# Patient Record
Sex: Female | Born: 1990 | Race: Black or African American | Hispanic: No | Marital: Single | Smoking: Never smoker
Health system: Southern US, Community
[De-identification: ages and names within clinical notes are randomized; demographics above are authoritative.]

## PROBLEM LIST (undated history)

## (undated) ENCOUNTER — Emergency Department: Admission: EM | Payer: Medicaid Other | Source: Home / Self Care

## (undated) DIAGNOSIS — F419 Anxiety disorder, unspecified: Secondary | ICD-10-CM

## (undated) DIAGNOSIS — F32A Depression, unspecified: Secondary | ICD-10-CM

## (undated) HISTORY — PX: OTHER SURGICAL HISTORY: SHX169

---
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
## 2013-11-19 ENCOUNTER — Emergency Department: Payer: Self-pay | Admitting: Internal Medicine

## 2014-01-27 ENCOUNTER — Emergency Department: Payer: Self-pay | Admitting: Emergency Medicine

## 2014-01-29 LAB — BASIC METABOLIC PANEL
Anion Gap: 3 — ABNORMAL LOW (ref 7–16)
BUN: 18 mg/dL (ref 7–18)
Calcium, Total: 9.1 mg/dL (ref 8.5–10.1)
Chloride: 107 mmol/L (ref 98–107)
Co2: 29 mmol/L (ref 21–32)
Creatinine: 0.84 mg/dL (ref 0.60–1.30)
EGFR (African American): >60
EGFR (Non-African Amer.): >60
Glucose: 102 mg/dL — ABNORMAL HIGH (ref 65–99)
Osmolality: 280 (ref 275–301)
Potassium: 4.1 mmol/L (ref 3.5–5.1)
Sodium: 139 mmol/L (ref 136–145)

## 2014-01-29 LAB — CBC
HCT: 36.1 % (ref 35.0–47.0)
HGB: 11.9 g/dL — AB (ref 12.0–16.0)
MCH: 27.2 pg (ref 26.0–34.0)
MCHC: 33 g/dL (ref 32.0–36.0)
MCV: 82 fL (ref 80–100)
PLATELETS: 247 10*3/uL (ref 150–440)
RBC: 4.39 10*6/uL (ref 3.80–5.20)
RDW: 14.3 % (ref 11.5–14.5)
WBC: 9.4 10*3/uL (ref 3.6–11.0)

## 2014-01-30 ENCOUNTER — Inpatient Hospital Stay: Payer: Self-pay | Admitting: Internal Medicine

## 2014-01-30 LAB — BETA STREP CULTURE(ARMC)

## 2014-01-31 LAB — CBC WITH DIFFERENTIAL/PLATELET
BASOS ABS: 0 10*3/uL (ref 0.0–0.1)
Basophil %: 0.3 %
EOS ABS: 0.2 10*3/uL (ref 0.0–0.7)
EOS PCT: 1.7 %
HCT: 34.2 % — AB (ref 35.0–47.0)
HGB: 11.1 g/dL — AB (ref 12.0–16.0)
LYMPHS PCT: 24.4 %
Lymphocyte #: 2.2 10*3/uL (ref 1.0–3.6)
MCH: 26.9 pg (ref 26.0–34.0)
MCHC: 32.4 g/dL (ref 32.0–36.0)
MCV: 83 fL (ref 80–100)
MONO ABS: 0.6 x10 3/mm (ref 0.2–0.9)
MONOS PCT: 6.8 %
Neutrophil #: 5.9 10*3/uL (ref 1.4–6.5)
Neutrophil %: 66.8 %
Platelet: 245 10*3/uL (ref 150–440)
RBC: 4.12 10*6/uL (ref 3.80–5.20)
RDW: 14.8 % — AB (ref 11.5–14.5)
WBC: 8.9 10*3/uL (ref 3.6–11.0)

## 2014-01-31 LAB — BASIC METABOLIC PANEL
Anion Gap: 7 (ref 7–16)
BUN: 13 mg/dL (ref 7–18)
CALCIUM: 8.7 mg/dL (ref 8.5–10.1)
CHLORIDE: 105 mmol/L (ref 98–107)
CO2: 25 mmol/L (ref 21–32)
CREATININE: 0.7 mg/dL (ref 0.60–1.30)
Glucose: 100 mg/dL — ABNORMAL HIGH (ref 65–99)
OSMOLALITY: 274 (ref 275–301)
Potassium: 3.7 mmol/L (ref 3.5–5.1)
SODIUM: 137 mmol/L (ref 136–145)

## 2014-02-04 LAB — CULTURE, BLOOD (SINGLE)

## 2014-02-09 ENCOUNTER — Observation Stay: Payer: Self-pay | Admitting: Internal Medicine

## 2014-02-09 LAB — CBC WITH DIFFERENTIAL/PLATELET
BASOS ABS: 0 10*3/uL (ref 0.0–0.1)
BASOS PCT: 0.3 %
EOS ABS: 0.1 10*3/uL (ref 0.0–0.7)
EOS PCT: 1.3 %
HCT: 35.1 % (ref 35.0–47.0)
HGB: 11.6 g/dL — AB (ref 12.0–16.0)
LYMPHS ABS: 1.9 10*3/uL (ref 1.0–3.6)
Lymphocyte %: 20.4 %
MCH: 27.1 pg (ref 26.0–34.0)
MCHC: 33.1 g/dL (ref 32.0–36.0)
MCV: 82 fL (ref 80–100)
MONO ABS: 0.6 x10 3/mm (ref 0.2–0.9)
MONOS PCT: 6 %
NEUTROS PCT: 72 %
Neutrophil #: 6.7 10*3/uL — ABNORMAL HIGH (ref 1.4–6.5)
Platelet: 334 10*3/uL (ref 150–440)
RBC: 4.28 10*6/uL (ref 3.80–5.20)
RDW: 14.6 % — ABNORMAL HIGH (ref 11.5–14.5)
WBC: 9.4 10*3/uL (ref 3.6–11.0)

## 2014-02-09 LAB — BASIC METABOLIC PANEL
ANION GAP: 9 (ref 7–16)
BUN: 18 mg/dL (ref 7–18)
CHLORIDE: 104 mmol/L (ref 98–107)
Calcium, Total: 8.9 mg/dL (ref 8.5–10.1)
Co2: 26 mmol/L (ref 21–32)
Creatinine: 0.81 mg/dL (ref 0.60–1.30)
EGFR (African American): >60
EGFR (Non-African Amer.): >60
GLUCOSE: 97 mg/dL (ref 65–99)
Osmolality: 279 (ref 275–301)
POTASSIUM: 3.6 mmol/L (ref 3.5–5.1)
Sodium: 139 mmol/L (ref 136–145)

## 2014-02-13 LAB — WOUND AEROBIC CULTURE

## 2014-05-24 ENCOUNTER — Emergency Department: Payer: Self-pay | Admitting: Emergency Medicine

## 2014-05-24 LAB — GC/CHLAMYDIA PROBE AMP

## 2014-05-24 LAB — WET PREP, GENITAL

## 2014-11-01 ENCOUNTER — Emergency Department: Payer: Self-pay | Admitting: Emergency Medicine

## 2014-12-19 NOTE — Consult Note (Signed)
Brief Consult Note: Diagnosis: Cellulitis left arm.   Patient was seen by consultant.   Recommend further assessment or treatment.   Orders entered.   Comments: 24 year old female seen yesterday for reddness and swelling left arm.  This started in a small nodule on volar aspect of forearm just distal to the flexion crease of the elbow a few days prior to admission.  Started on IV antibiotics Thursday 01/29/14. Seen by myself on 01/30/14 at which time there was no fever and the white blood count was entirely normal.  There was some reddness and swelling of the forearm and elbow.  However, the passive range of motion of the elbow was non painful and I did not feel that there was any sign of joint involvement.  neurovascular status was normal. The nodule may point in time and need lancing but will observe for now,  X-rays: normal.  Radiologist and I feel that there was no effusion.  Imp: Cellulitis  Rx:  IV antibiotics 48 hrs.  Then switch to po antibiotics if progressing well.        Moist heat to arm.  Electronic Signatures: Valinda HoarMiller, Dorcas Melito E (MD)  (Signed 06-Jun-15 23:20)  Authored: Brief Consult Note   Last Updated: 06-Jun-15 23:20 by Valinda HoarMiller, Trystan Akhtar E (MD)

## 2014-12-19 NOTE — Discharge Summary (Signed)
PATIENT NAME:  Veronica Ochoa, Veronica Ochoa MR#:  [id number] DATE OF BIRTH:  1991-06-12  DATE OF ADMISSION:  02/09/2014 DATE OF DISCHARGE:  02/11/2014  PRIMARY CARE PHYSICIAN:  At [facility].  FINAL DIAGNOSES: 1.  Left elbow pain with cellulitis and abscess.  2.  Acne vulgaris.   MEDICATIONS ON DISCHARGE: Include Myorisan 40 mg 1 capsule orally once a day, Implanon 68 mg subcutaneous implant, acetaminophen/hydrocodone 10/325 one tablet every 6 hours as needed for pain, clindamycin 300 mg 2 capsules every 8 hours for 8 days.   DIET: Regular diet, regular consistency.   Keep wound covered if draining. Follow up with Dr. Deeann SaintHoward Miller next week, orthopedics.   HOSPITAL COURSE: The patient was admitted as an observation on 02/09/2014 and discharged 02/11/2014. Came in with swelling and pain at the left elbow. In the ER, did have incision and drainage and was started on p.o. clindamycin.  LABORATORY AND RADIOLOGICAL DATA DURING THE HOSPITAL COURSE: Included a white blood cell count of 9.4, hemoglobin 11.6, hematocrit 35.1, platelet count of 334, glucose 97, BUN 18, creatinine 0.81. Sodium 139, potassium 3.6, chloride 104, CO2 of 26, calcium 8.9. Wound culture holding for possible pathogen. No organism seen, few white blood cells.  The patient was seen in consultation by Dr. Deeann SaintHoward Miller.  For her left elbow pain, cellulitis and abscess, the patient had an incision and cultures sent off from the ER; swelling did go down. Wound culture still holding for pathogen but it had improved and fluctuance had went down. The patient was on clindamycin orally since being here. The patient was discharged home with p.o. clindamycin and Norco to go home with. Follow up next week with Dr. Deeann SaintHoward Miller. Since this is a readmission and a reoccurrence of the same pain, I would like to follow up as outpatient in order to prevent another hospital visit. Upon going home, the left elbow was able to move in almost full  range of motion. The fluctuance had gone down. There was no erythema. The patient remained afebrile during the entire hospital course and no white count.  TIME SPENT ON DISCHARGE:  32 minutes.    ____________________________ Herschell Dimesichard J. Renae GlossWieting, MD rjw:ce D: 02/11/2014 16:49:06 ET T: 02/11/2014 17:37:36 ET JOB#: [id number]  cc: Herschell Dimesichard J. Renae GlossWieting, MD, <Dictator> [facility] Valinda HoarHoward E. Miller, MD Salley ScarletICHARD J WIETING MD ELECTRONICALLY SIGNED 02/13/2014 16:17

## 2014-12-19 NOTE — Consult Note (Signed)
Brief Consult Note: Diagnosis: Left elbow infection.   Patient was seen by consultant.   Recommend further assessment or treatment.   Orders entered.   Comments: 24 year old female readmitted for left elbow infection.  Discharged on Keflex last week but returned with small abscess over proximal ulna yesterday which was lanced and cultured.  complains of some pain in proximal forearm.  No fever/chill.  white blood count normal.    Exam:  some soft tissue swelling consistent with cellulitis.  Small dried area proximal ulna that was lanced.  Elbow range of motion good with sign of joint involvement.  circulation/sensation/motor function good distally   Imp: Cellulitis left proximal forearm  Rx:  continue antibiotics and await cultures        moist heat to elbow        will follow.  Electronic Signatures: Valinda HoarMiller, Howard E (MD)  (Signed 16-Jun-15 13:49)  Authored: Brief Consult Note   Last Updated: 16-Jun-15 13:49 by Valinda HoarMiller, Howard E (MD)

## 2014-12-19 NOTE — Discharge Summary (Signed)
PATIENT NAME:  Veronica Ochoa, Veronica Ochoa DATE OF BIRTH:  September 16, 1990  DATE OF ADMISSION:  01/30/2014 DATE OF DISCHARGE:  02/02/2014  DISCHARGE DIAGNOSES: 1.  Cellulitis of the left elbow, much better overall, but still has some difficulty with range of motion, which is likely due to inflammation.  2.  Left breast furuncle, partly opened and dried. No signs of infection.   SECONDARY DIAGNOSES: Acne vulgaris.   CONSULTATIONS: Orthopedics, Dr. Deeann SaintHoward Miller.   PROCEDURES AND RADIOLOGY: Left elbow x-ray on June 5 showed no fracture, no bony abnormality, no joint effusion. Tissue edema/cellulitis.   MAJOR LABORATORY PANEL: Blood cultures x2 were negative.   HISTORY AND SHORT HOSPITAL COURSE: The patient is a 24 year old female with no significant medical problems, who was admitted for left elbow swelling, thought to have possible cellulitis. Please see Dr. Rob HickmanGouru's dictated history and physical for further details. The patient was evaluated by orthopedics, Dr. Deeann SaintHoward Miller, considering difficulty with range of motion of arm, concerning for possible cellulitis or joint infection, which was ruled out. Her x-ray was negative. Dr. Hyacinth MeekerMiller recommended moist heat to the area and switching to an oral antibiotic for possible discharge, which was done on June 8, as she was feeling significantly better. Her range of motion was improved and her redness was resolved on her left elbow. On the date of discharge, her vital signs were as follows: Temperature 98.4, heart rate 76 per minute, respirations 18 per minute, blood pressure 109/68 mmHg. She was saturating 96% on room air.   DISCHARGE PERTINENT PHYSICAL EXAMINATION:  CARDIOVASCULAR: S1, S2 normal. No murmurs, rubs, or gallop.  LUNGS: Clear to auscultation bilaterally. No wheezing, rales, rhonchi, or crepitation.  ABDOMEN: Soft, benign.  NEUROLOGIC: Nonfocal examination. Her left arm exam showed no redness, minimal swelling around the elbow area.  Passive range of motion was not painful. There was no sign of joint involvement. Neuromuscular status was normal.  All other physical examination remained at baseline.    DISCHARGE MEDICATIONS: 1.  Myorisan 40 mg p.o. daily.  2.  Keflex 500 mg p.o. 4 times a day for 7 days.  3.  Enteric-coated Naprosyn 500 mg p.o. b.i.d. for 7 days.    DISCHARGE DIET: Regular.   DISCHARGE ACTIVITY: As tolerated.   DISCHARGE INSTRUCTIONS AND FOLLOW-UP: The patient was instructed to follow-up with [facility] in 1 to 2 weeks. She was recommended to have a hot compress/moist heat over the left elbow area.   TOTAL TIME DISCHARGING THIS PATIENT: 45 minute.    ____________________________ Navaya Wiatrek S. Sherryll BurgerShah, MD vss:cg D: 02/02/2014 21:42:28 ET T: 02/03/2014 02:55:33 ET JOB#: [id number]  cc: Jerrik Housholder S. Sherryll BurgerShah, MD, <Dictator> Dr. Leeroy Bockhelsea, [facility] Valinda HoarHoward E. Miller, MD  Ellamae SiaVIPUL S [facility] MD ELECTRONICALLY SIGNED 02/04/2014 20:09

## 2014-12-19 NOTE — H&P (Signed)
PATIENT NAME:  Veronica Ochoa, Veronica Ochoa MR#:  [id number] DATE OF BIRTH:  March 07, 1991  DATE OF ADMISSION:  02/09/2014  PRIMARY CARE PHYSICIAN:  None.  HISTORY OF PRESENT ILLNESS:  Ms. Veronica Ochoa is a 24 year old African American female with history of acne vulgaris, comes to the Emergency Room with complaints of left elbow swelling and pain. She was recently admitted June 5th, discharged June 8th for left elbow cellulitis. She was discharged on p.o. Keflex. She has 1 more day left for her Keflex to get over. She comes in today because she started noticing swelling on the left elbow. It was about a golf ball size per ER physician. She underwent I and D in the Emergency Room. Cultures have been sent. She will be started now on p.o. clindamycin. She has no history of MRSA. Blood cultures last admission was negative. Wound cultures have been sent today.   PAST MEDICAL HISTORY:  Acne vulgaris.   MEDICATIONS: 1.  Keflex 500 mg 4 times a day, 1 more last day left.  2.  Naprosyn 500 mg b.i.d.  3.  Implanon 68 mg subcutaneous implant 1 each subcutaneous once.  4.  Myorisan 40 mg p.o. daily. This is for her acne.   ALLERGIES: MORPHINE, PERCOCET, VANCOMYCIN.   SOCIAL HISTORY: She lives at home with her fiance. No history of smoking, alcohol or drug use.   FAMILY HISTORY: Both mother and father are healthy. Grandmother has history of asthma and diabetes.   PAST SURGICAL HISTORY: None.   REVIEW OF SYSTEMS: CONSTITUTIONAL: No fever, fatigue, weakness.  EYES: No blurred or double vision, glaucoma or cataract.  ENT: No tinnitus, ear pain, hearing loss or postnasal drip.  RESPIRATORY: No cough, wheeze, hemoptysis or dyspnea.  CARDIOVASCULAR: No chest pain, orthopnea, edema or dyspnea on exertion.  GASTROINTESTINAL: No nausea, vomiting, diarrhea, abdominal pain. No gastroesophageal reflux disease.  GENITOURINARY: No dysuria, hematuria or frequency.  ENDOCRINE: No polyuria, nocturia or thyroid problems.   HEMATOLOGY: No anemia or easy bruising, no bleeding.  SKIN: The patient does have a lesion over the left elbow, status post incision and drainage.   MUSCULOSKELETAL: Pain in the neck, pain in the left elbow.  NEUROLOGIC: No cerebrovascular accident, transient ischemic attack,  ataxia, dementia.  PSYCHIATRIC: No anxiety, depression, bipolar disorder.  All other systems reviewed are negative.   PHYSICAL EXAMINATION: GENERAL:  The patient is awake, alert, oriented x 3.  VITAL SIGNS:  Afebrile. Pulse is 80, blood pressure is 116/56. Sats are 98% on room air.  HEENT: Atraumatic, normocephalic. Pupils are equal, round and reactive to light and accommodation. EOM intact. Oral mucosa is moist.  NECK: Supple. No JVD. No carotid bruit.  LUNGS: Clear to auscultation bilaterally. No rales, rhonchi, respiratory distress or labored breathing.  CARDIOVASCULAR:  Both the heart sounds are normal. Rate, rhythm regular. PMI not lateralized. Chest is nontender.  EXTREMITIES:  The patient does have some pain over the left elbow. I and D has been done.  Oozing some bright red blood. Could not appreciate much cellulitis due to patient'Ochoa original skin color. ABDOMEN: Soft, benign, nontender. No organomegaly. Positive bowel sounds.  NEUROLOGIC: Grossly intact cranial nerves II through XII. No motor or sensory deficit.  PSYCHIATRIC: The patient is awake, alert, oriented x 3.   LABORATORY DATA:  CBC within normal limits. Basic metabolic panel within normal limits.   ASSESSMENT AND PLAN:  A 24 year old Ms. Veronica Ochoa with history of left elbow cellulitis and pain, comes in with:  1.  Left  elbow abscess, status post incision and drainage in the Emergency Room. The patient had about golf ball-sized abscess over the left elbow where she was treated for cellulitis with p.o. Keflex. We will give her p.o. clindamycin, see if she tolerates the clindamycin. She had some drug reaction with rash on p.o. vancomycin. Wound  cultures have been sent. Change antibiotics according to sensitivity. We will do surgical consultation to just evaluate the wound.  2.  Acne vulgaris. The patient will continue her home medication.  3.  Further workup according to the patient'Ochoa clinical course. Hospital admission plan was discussed with the patient and mother.   TIME SPENT: 40 minutes.     ____________________________ Wylie Hail Allena Katz, MD sap:cs D: 02/09/2014 18:29:48 ET T: 02/09/2014 19:01:12 ET JOB#: [id number]  cc: Chea Malan A. Allena Katz, MD, <Dictator> Willow Ora MD ELECTRONICALLY SIGNED 02/28/2014 10:26

## 2014-12-19 NOTE — H&P (Signed)
PATIENT NAME:  Veronica Ochoa, Veronica Ochoa MR#:  [id number] DATE OF BIRTH:  1991/02/11  DATE OF ADMISSION:  01/30/2014  PRIMARY CARE PROVIDER:  Nona Dell, PA.  REFERRING PHYSICIAN:  Dr. York Cerise.  CHIEF COMPLAINT:  Left elbow swelling.   HISTORY OF PRESENT ILLNESS:  The patient is a 24 year old African American female with a past medical history of acne vulgaris is presenting to the ER with a chief complaint of left elbow pain and swelling.  The patient is reporting that approximately 3 to 4 days ago she has noticed a knot underneath her skin in the left elbow area.  Slowly it has become bigger and bigger in size associated with pain and swelling.  Today her elbow is swollen and the patient is unable to fold her left elbow area.  Denies any discharge.  Denies any trauma or bug bites.  Denies any fever, nausea or vomiting.  The patient reported that frequently she gets boils and she gets treatment for acne vulgaris.  Denies any history of MRSA in the past.  White count is normal.  Elbow x-ray is pending.  The patient was given IV cefepime and vancomycin and hospitalist team is called to admit the patient.  During my examination, the patient is uncomfortable from left elbow pain.  Her fiance is sitting next to her.  No similar complaints in the past.  No other complaints.   PAST MEDICAL HISTORY:  Acne vulgaris.   PAST SURGICAL HISTORY:  None.   ALLERGIES:  No known drug allergies.   PSYCHOSOCIAL HISTORY:  Lives at home with fiance.  No history of smoking, alcohol or illicit drug usage.   HOME MEDICATIONS:  Isotretinoin 40 mg 1 capsule by mouth once daily, fexofenadine 1 tablet by mouth every 12 hours as needed for allergies.   FAMILY HISTORY:  Both mother and father are healthy.  Grandmother has history of asthma and diabetes mellitus.    REVIEW OF SYSTEMS:  CONSTITUTIONAL:  Denies any fever, fatigue, weakness.  EYES:  Denies blurry vision, double vision.  EARS, NOSE, THROAT:  Denies any tinnitus,  epistaxis, discharge.  RESPIRATORY:  Denies cough, COPD.  CARDIOVASCULAR:  No chest pain, palpitations, syncope.  GASTROINTESTINAL:  Denies nausea, vomiting, diarrhea, abdominal pain.  GENITOURINARY:  No dysuria or hematuria.   GYNECOLOGIC AND BREAST:  Denies breast mass or vaginal discharge.  ENDOCRINE:  Denies polyuria, nocturia, thyroid problems.  HEMATOLOGIC AND LYMPHATIC:  No anemia, easy bruising, bleeding.  INTEGUMENTARY:  Has history of acne vulgaris.  Denies any rashes.  Usually develops multiple abscesses which resolves spontaneously.  MUSCULOSKELETAL:  No joint pain in the neck and back.  Denies shoulder pain.  Denies any gout.  NEUROLOGIC:  Denies vertigo, ataxia, dementia.  PSYCHIATRIC:  No ADD, OCD.   PHYSICAL EXAMINATION: VITAL SIGNS:  Temperature 98.3, pulse 88, respirations 18, blood pressure 120/74, pulse ox 98%.  GENERAL APPEARANCE:  Not under acute distress.  Moderately built and nourished.  HEENT:  Normocephalic, atraumatic.  Pupils are equal, reacting to light and accommodation.  No scleral icterus.  No conjunctival injection.  No sinus tenderness.  No postnasal drip.  NECK:  Supple.  No JVD.  No thyromegaly.  Range of motion is intact.  LUNGS:  Clear to auscultation bilaterally.  No accessory muscle usage.  No anterior chest wall tenderness on palpation.  CARDIAC:  S1, S2 normal.  Regular rate and rhythm.  No murmur.  GASTROINTESTINAL:  Soft.  Bowel sounds are positive in all four quadrants.  Nontender, nondistended.  No hepatosplenomegaly.  No masses felt.  NEUROLOGIC:  Awake, alert, oriented x 3.  Motor and sensory grossly intact.  Reflexes are 2+.  Cranial nerves II through XII are intact.  EXTREMITIES:  Left upper extremity, elbow area is erythematous, tender with effusion.  The patient is unable to move her elbow completely.  Range of motion is limited from pain and tenderness and edema.  No other lesions were noted in the body.  PSYCHIATRIC:  Normal mood and  affect.    LABORATORY DATA:  Left elbow x-ray is pending.  CBC is normal except hemoglobin at 11.9.  Chem-8, glucose 102.  Anion gap 3.  The rest of the BMP is normal.   ASSESSMENT AND PLAN:  A 24 year old African American female presenting to the Emergency Room with a chief complaint of left elbow swelling, pain, and redness, will be admitted with the following assessment and plan.  1.  Left elbow cellulitis with abscess.  Blood cultures were obtained.  The patient was given intravenous cefepime and vancomycin.  We will continue intravenous Rocephin and vancomycin.  Orthopedic consult is placed.  Left elbow x-ray is pending at this time.  We will provide her Tylenol as needed for fever and morphine as needed for pain.  2.  Acne.  The patient can continue taking her home medication isotretinoin.   3.  We will provide her gastrointestinal prophylaxis.  4.  Deep vein thrombosis prophylaxis is not needed as the patient is ambulatory and is at low risk. 5.  CODE STATUS:  SHE IS FULL CODE.   Diagnosis and plan of care was discussed in detail with the patient and her fiance at bedside.  They both verbalized understanding of the plan.   Total time spent on the admission is 45 minutes.     ____________________________ Ramonita LabAruna Trenee Igoe, MD ag:ea D: 01/30/2014 01:49:22 ET T: 01/30/2014 04:10:08 ET JOB#: [id number]  cc: Ramonita LabAruna Lilias Lorensen, MD, <Dictator> Joni Reiningonald K. Smith, PA Ramonita LabARUNA Gurley Climer MD ELECTRONICALLY SIGNED 02/02/2014 1:06

## 2015-05-27 DIAGNOSIS — L309 Dermatitis, unspecified: Secondary | ICD-10-CM | POA: Insufficient documentation

## 2015-05-27 DIAGNOSIS — E669 Obesity, unspecified: Secondary | ICD-10-CM | POA: Insufficient documentation

## 2015-05-27 HISTORY — DX: Dermatitis, unspecified: L30.9

## 2015-06-07 ENCOUNTER — Encounter: Payer: Self-pay | Admitting: Emergency Medicine

## 2015-06-07 ENCOUNTER — Emergency Department
Admission: EM | Admit: 2015-06-07 | Discharge: 2015-06-07 | Disposition: A | Discharge status: Home / Self Care | Payer: Self-pay | Attending: Emergency Medicine | Admitting: Emergency Medicine

## 2015-06-07 DIAGNOSIS — L02214 Cutaneous abscess of groin: Secondary | ICD-10-CM | POA: Insufficient documentation

## 2015-06-07 DIAGNOSIS — L0291 Cutaneous abscess, unspecified: Secondary | ICD-10-CM

## 2015-06-07 MED ORDER — TRAMADOL HCL 50 MG PO TABS: 50.0000 mg | ORAL_TABLET | Freq: Four times a day (QID) | ORAL | Status: AC | PRN

## 2015-06-07 MED ORDER — SULFAMETHOXAZOLE-TRIMETHOPRIM 800-160 MG PO TABS: 1.0000 | ORAL_TABLET | Freq: Two times a day (BID) | ORAL | Status: DC

## 2015-06-07 NOTE — ED Notes (Signed)
Pt presents with ulceration and palpable mass to left hip and groin area. Pt states she began to feel a small knot in the area approximately a week ago. Pt reports three days ago a blister appeared and then opened up. Upon exam the area is tender to touch. Small ulceration is noted with clear drainage. Pt denies fever at home. Pt states area is more painful upon movement.

## 2015-06-07 NOTE — Discharge Instructions (Signed)
Warm compresses to area 2-3 times a day for 5 minutes.  Change dressing as needed. Abscess An abscess (boil or furuncle) is an infected area on or under the skin. This area is filled with yellowish-white fluid (pus) and other material (debris). HOME CARE   Only take medicines as told by your doctor.  If you were given antibiotic medicine, take it as directed. Finish the medicine even if you start to feel better.  If gauze is used, follow your doctor's directions for changing the gauze.  To avoid spreading the infection:  Keep your abscess covered with a bandage.  Wash your hands well.  Do not share personal care items, towels, or whirlpools with others.  Avoid skin contact with others.  Keep your skin and clothes clean around the abscess.  Keep all doctor visits as told. GET HELP RIGHT AWAY IF:   You have more pain, puffiness (swelling), or redness in the wound site.  You have more fluid or blood coming from the wound site.  You have muscle aches, chills, or you feel sick.  You have a fever. MAKE SURE YOU:   Understand these instructions.  Will watch your condition.  Will get help right away if you are not doing well or get worse.   This information is not intended to replace advice given to you by your health care provider. Make sure you discuss any questions you have with your health care provider.   Document Released: 01/31/2008 Document Revised: 02/13/2012 Document Reviewed: 10/28/2011 Elsevier Interactive Patient Education [employer]

## 2015-06-07 NOTE — ED Notes (Signed)
Pt presents with palpable mass in left upper groin area. Ulceration noted. Area is painful to touch.

## 2015-06-07 NOTE — ED Provider Notes (Signed)
[facility] Emergency Department Provider Note  ____________________________________________  Time seen: Approximately 12:25 PM  I have reviewed the triage vital signs and the nursing notes.   HISTORY  Chief Complaint Cyst    HPI Veronica Ochoa is a 24 y.o. female complaining of a papular lesion left upper groin area for one week. Patient stated area is tender to palpation.Patient stated area ruptured 3 days ago and started having a discharge. Patient stated areas tender to palpations. Patient denies any fever or chills associated with this complaint. Patient is rating the pain as a 7/10. No palliative measures taken for this complaint.   History reviewed. No pertinent past medical history.  There are no active problems to display for this patient.   History reviewed. No pertinent past surgical history.  No current outpatient prescriptions on file.  Allergies Morphine and related  No family history on file.  Social History Social History  Substance Use Topics  . Smoking status: Never Smoker   . Smokeless tobacco: Never Used  . Alcohol Use: Yes     Comment: occasional    Review of Systems Constitutional: No fever/chills Eyes: No visual changes. ENT: No sore throat. Cardiovascular: Denies chest pain. Respiratory: Denies shortness of breath. Gastrointestinal: No abdominal pain.  No nausea, no vomiting.  No diarrhea.  No constipation. Genitourinary: Negative for dysuria. Musculoskeletal: Negative for back pain. Skin: Negative for rash. Edematous papular lesion with a greenish discharge left upper groin area. Neurological: Negative for headaches, focal weakness or numbness. 10-point ROS otherwise negative.  ____________________________________________   PHYSICAL EXAM:  VITAL SIGNS: ED Triage Vitals  Enc Vitals Group     BP 06/07/15 1213 120/71 mmHg     Pulse Rate 06/07/15 1213 89     Resp 06/07/15 1213 18     Temp 06/07/15 1213  98.5 F (36.9 C)     Temp Source 06/07/15 1213 Oral     SpO2 06/07/15 1213 97 %     Weight 06/07/15 1213 220 lb (99.791 kg)     Height 06/07/15 1213  (1.702 m)     Head Cir --      Peak Flow --      Pain Score 06/07/15 1214 7     Pain Loc --      Pain Edu? --      Excl. in GC? --     Constitutional: Alert and oriented. Well appearing and in no acute distress. Eyes: Conjunctivae are normal. PERRL. EOMI. Head: Atraumatic. Nose: No congestion/rhinnorhea. Mouth/Throat: Mucous membranes are moist.  Oropharynx non-erythematous. Neck: No stridor. No cervical spine tenderness to palpation. Hematological/Lymphatic/Immunilogical: No cervical lymphadenopathy. Cardiovascular: Normal rate, regular rhythm. Grossly normal heart sounds.  Good peripheral circulation. Respiratory: Normal respiratory effort.  No retractions. Lungs CTAB. Gastrointestinal: Soft and nontender. No distention. No abdominal bruits. No CVA tenderness. Musculoskeletal: No lower extremity tenderness nor edema.  No joint effusions. Neurologic:  Normal speech and language. No gross focal neurologic deficits are appreciated. No gait instability. Skin:  Skin is warm, dry and intact. Erythematous papular lesion with greenish discharge and upper groin area. Psychiatric: Mood and affect are normal. Speech and behavior are normal.  ____________________________________________   LABS (all labs ordered are listed, but only abnormal results are displayed)  Labs Reviewed - No data to display ____________________________________________  EKG   ____________________________________________  RADIOLOGY   ____________________________________________   PROCEDURES  Procedure(s) performed: None  Critical Care performed: No  ____________________________________________   INITIAL IMPRESSION / ASSESSMENT  AND PLAN / ED COURSE  Pertinent labs & imaging results that were available during my care of the patient were reviewed  by me and considered in my medical decision making (see chart for details).  Abscess right upper groin area. Patient given prescription for Bactrim DS and tramadol. Patient advised to apply warm compresses to the area twice a day and advised not to try to express any discharge from the lesion. Patient advised return by ER for condition worsens. ____________________________________________   FINAL CLINICAL IMPRESSION(S) / ED DIAGNOSES  Final diagnoses:  Abscess      Joni Reining, PA-C 06/07/15 1243  Myrna Blazer, MD 06/07/15 [phone]

## 2015-07-01 DIAGNOSIS — F4325 Adjustment disorder with mixed disturbance of emotions and conduct: Secondary | ICD-10-CM

## 2015-07-01 HISTORY — DX: Adjustment disorder with mixed disturbance of emotions and conduct: F43.25

## 2016-03-31 ENCOUNTER — Encounter: Payer: Self-pay | Admitting: Emergency Medicine

## 2016-03-31 ENCOUNTER — Emergency Department
Admission: EM | Admit: 2016-03-31 | Discharge: 2016-03-31 | Disposition: A | Discharge status: Home / Self Care | Payer: Self-pay | Attending: Emergency Medicine | Admitting: Emergency Medicine

## 2016-03-31 DIAGNOSIS — Z792 Long term (current) use of antibiotics: Secondary | ICD-10-CM | POA: Insufficient documentation

## 2016-03-31 DIAGNOSIS — R21 Rash and other nonspecific skin eruption: Secondary | ICD-10-CM

## 2016-03-31 DIAGNOSIS — L089 Local infection of the skin and subcutaneous tissue, unspecified: Secondary | ICD-10-CM | POA: Insufficient documentation

## 2016-03-31 MED ORDER — TRIAMCINOLONE ACETONIDE 0.1 % EX CREA: 1.0000 "application " | TOPICAL_CREAM | Freq: Two times a day (BID) | CUTANEOUS | 0 refills | Status: DC

## 2016-03-31 NOTE — ED Notes (Signed)
Discharge instructions reviewed with patient. Patient verbalized understanding. Patient ambulated to lobby without difficulty.   

## 2016-03-31 NOTE — ED Notes (Signed)
Provider in to see, assess and discharge patient prior to RN. Please see PA note for assessment.  

## 2016-03-31 NOTE — ED Triage Notes (Signed)
Pt has bump noticed on back of right elbow 3 days ago and woke up today with rash on the inner portion of elbow. Denies having any fever, no fever upon assessment. Pt states rash is sore but denies itching.

## 2016-03-31 NOTE — Discharge Instructions (Signed)
Continue warm compresses to the back of your elbow but do not stick anything into the pustule there. Triamcinolone cream small amount to your rash twice a day. Follow-up with [facility] if any continued problems.

## 2016-03-31 NOTE — ED Provider Notes (Signed)
[facility] Emergency Department Provider Note  ____________________________________________   First MD Initiated Contact with Patient 03/31/16 1644     (approximate)  I have reviewed the triage vital signs and the nursing notes.   HISTORY  Chief Complaint Rash   HPI Veronica Ochoa is a 25 y.o. female is here with complaint of rash to her right elbow area since this morning.Patient denies any itching to the area. Patient also has a small bump to the back of her elbow for 3 days. She states that she use warm compresses to the area without any improvement. Patient denies any fever or chills. She has had no difficulty moving her arm today. She has not used any over-the-counter medication to her rash. Currently she rates her pain as 7/10. Patient has discomfort with range of motion of her elbow.     History reviewed. No pertinent past medical history.  There are no active problems to display for this patient.   History reviewed. No pertinent surgical history.  Prior to Admission medications   Medication Sig Start Date End Date Taking? Authorizing Provider  sulfamethoxazole-trimethoprim (BACTRIM DS,SEPTRA DS) 800-160 MG tablet Take 1 tablet by mouth 2 (two) times daily. 06/07/15   Joni Reining, PA-C  traMADol (ULTRAM) 50 MG tablet Take 1 tablet (50 mg total) by mouth every 6 (six) hours as needed. 06/07/15 06/06/16  Joni Reining, PA-C  triamcinolone cream (KENALOG) 0.1 % Apply 1 application topically 2 (two) times daily. 03/31/16   Tommi Rumps, PA-C    Allergies Morphine and related  No family history on file.  Social History Social History  Substance Use Topics  . Smoking status: Never Smoker  . Smokeless tobacco: Never Used  . Alcohol use Yes     Comment: occasional    Review of Systems Constitutional: No fever/chills Cardiovascular: Denies chest pain. Respiratory: Denies shortness of breath. Gastrointestinal: No abdominal pain.   No nausea, no vomiting.  Musculoskeletal: Negative for back pain. Negative for right elbow pain. Skin: Positive for rash right elbow. Neurological: Negative for  numbness.  10-point ROS otherwise negative.  ____________________________________________   PHYSICAL EXAM:  VITAL SIGNS: ED Triage Vitals  Enc Vitals Group     BP 03/31/16 1623 140/63     Pulse Rate 03/31/16 1623 90     Resp 03/31/16 1623 16     Temp 03/31/16 1623 98.2 F (36.8 C)     Temp src --      SpO2 03/31/16 1623 99 %     Weight 03/31/16 1623 215 lb (97.5 kg)     Height 03/31/16 1623 5\' 7"  (1.702 m)     Head Circumference --      Peak Flow --      Pain Score 03/31/16 1625 7     Pain Loc --      Pain Edu? --      Excl. in GC? --     Constitutional: Alert and oriented. Well appearing and in no acute distress. Eyes: Conjunctivae are normal. PERRL. EOMI. Head: Atraumatic. Nose: No congestion/rhinnorhea. Neck: No stridor.   Cardiovascular: Normal rate, regular rhythm. Grossly normal heart sounds.  Good peripheral circulation. Respiratory: Normal respiratory effort.  No retractions. Lungs CTAB. Musculoskeletal: No lower extremity tenderness nor edema.  No joint effusions. Neurologic:  Normal speech and language. No gross focal neurologic deficits are appreciated.  Skin:  Skin is warm, dry.  Posterior right elbow has one single pustule present. There is no  cellulitis surrounding this area. No drainage is noted. Pustule at this present is intact. Anterior portion of the right elbow has a single erythematous linear area without warmth or tenderness. There is no vesicles or papules present. Psychiatric: Mood and affect are normal. Speech and behavior are normal.  ____________________________________________   LABS (all labs ordered are listed, but only abnormal results are displayed)  Labs Reviewed - No data to display   PROCEDURES  Procedure(s) performed: None  Procedures  Critical Care performed:  No  ____________________________________________   INITIAL IMPRESSION / ASSESSMENT AND PLAN / ED COURSE  Pertinent labs & imaging results that were available during my care of the patient were reviewed by me and considered in my medical decision making (see chart for details).    Clinical Course  Patient is continue some warm compresses to posterior aspect of her right elbow. Patient is given a prescription for triamcinolone cream to apply to her rash twice a day. She is follow-up with [facility] if any continued problems.   ____________________________________________   FINAL CLINICAL IMPRESSION(S) / ED DIAGNOSES  Final diagnoses:  Rash and nonspecific skin eruption  Skin pustule      NEW MEDICATIONS STARTED DURING THIS VISIT:  New Prescriptions   TRIAMCINOLONE CREAM (KENALOG) 0.1 %    Apply 1 application topically 2 (two) times daily.     Note:  This document was prepared using Dragon voice recognition software and may include unintentional dictation errors.    Tommi Rumps, PA-C 03/31/16 1707    Tommi Rumps, PA-C 03/31/16 1723    Loleta Rose, MD 04/03/16 [phone]

## 2016-10-16 DIAGNOSIS — K2900 Acute gastritis without bleeding: Secondary | ICD-10-CM | POA: Insufficient documentation

## 2016-10-16 DIAGNOSIS — Z79899 Other long term (current) drug therapy: Secondary | ICD-10-CM | POA: Insufficient documentation

## 2016-10-16 DIAGNOSIS — F4320 Adjustment disorder, unspecified: Secondary | ICD-10-CM | POA: Insufficient documentation

## 2016-10-16 LAB — URINE DRUG SCREEN, QUALITATIVE (ARMC ONLY)
Amphetamines, Ur Screen: NOT DETECTED
BARBITURATES, UR SCREEN: NOT DETECTED
BENZODIAZEPINE, UR SCRN: NOT DETECTED
Cannabinoid 50 Ng, Ur ~~LOC~~: NOT DETECTED
Cocaine Metabolite,Ur ~~LOC~~: NOT DETECTED
MDMA (Ecstasy)Ur Screen: NOT DETECTED
METHADONE SCREEN, URINE: NOT DETECTED
OPIATE, UR SCREEN: NOT DETECTED
Phencyclidine (PCP) Ur S: NOT DETECTED
TRICYCLIC, UR SCREEN: NOT DETECTED

## 2016-10-16 LAB — COMPREHENSIVE METABOLIC PANEL
ALBUMIN: 4.2 g/dL (ref 3.5–5.0)
ALK PHOS: 65 U/L (ref 38–126)
ALT: 13 U/L — ABNORMAL LOW (ref 14–54)
AST: 18 U/L (ref 15–41)
Anion gap: 9 (ref 5–15)
BILIRUBIN TOTAL: 0.6 mg/dL (ref 0.3–1.2)
BUN: 12 mg/dL (ref 6–20)
CALCIUM: 9 mg/dL (ref 8.9–10.3)
CO2: 22 mmol/L (ref 22–32)
CREATININE: 0.8 mg/dL (ref 0.44–1.00)
Chloride: 107 mmol/L (ref 101–111)
GFR calc Af Amer: >60 mL/min (ref >60)
GLUCOSE: 86 mg/dL (ref 65–99)
Potassium: 3.6 mmol/L (ref 3.5–5.1)
Sodium: 138 mmol/L (ref 135–145)
TOTAL PROTEIN: 7.4 g/dL (ref 6.5–8.1)

## 2016-10-16 LAB — CBC
HCT: 34.6 % — ABNORMAL LOW (ref 35.0–47.0)
Hemoglobin: 11.9 g/dL — ABNORMAL LOW (ref 12.0–16.0)
MCH: 27.8 pg (ref 26.0–34.0)
MCHC: 34.2 g/dL (ref 32.0–36.0)
MCV: 81.3 fL (ref 80.0–100.0)
PLATELETS: 278 10*3/uL (ref 150–440)
RBC: 4.26 MIL/uL (ref 3.80–5.20)
RDW: 14.7 % — AB (ref 11.5–14.5)
WBC: 10.8 10*3/uL (ref 3.6–11.0)

## 2016-10-16 LAB — POCT PREGNANCY, URINE: PREG TEST UR: NEGATIVE

## 2016-10-16 NOTE — ED Triage Notes (Signed)
Pt reports having abd pain in mid sternal area for the entire day - denies vomiting or diarrhea but reports nausea - denies heartburn or indigestion

## 2016-10-17 ENCOUNTER — Emergency Department
Admission: EM | Admit: 2016-10-17 | Discharge: 2016-10-17 | Disposition: A | Discharge status: Home / Self Care | Payer: Self-pay | Attending: Emergency Medicine | Admitting: Emergency Medicine

## 2016-10-17 ENCOUNTER — Emergency Department: Payer: Self-pay

## 2016-10-17 DIAGNOSIS — R1013 Epigastric pain: Secondary | ICD-10-CM

## 2016-10-17 DIAGNOSIS — K2900 Acute gastritis without bleeding: Secondary | ICD-10-CM

## 2016-10-17 DIAGNOSIS — F4320 Adjustment disorder, unspecified: Secondary | ICD-10-CM

## 2016-10-17 DIAGNOSIS — R109 Unspecified abdominal pain: Secondary | ICD-10-CM

## 2016-10-17 LAB — SALICYLATE LEVEL

## 2016-10-17 LAB — URINALYSIS, COMPLETE (UACMP) WITH MICROSCOPIC
Bilirubin Urine: NEGATIVE
GLUCOSE, UA: NEGATIVE mg/dL
HGB URINE DIPSTICK: NEGATIVE
KETONES UR: NEGATIVE mg/dL
LEUKOCYTES UA: NEGATIVE
Nitrite: NEGATIVE
Protein, ur: 30 mg/dL — AB
RBC / HPF: NONE SEEN RBC/hpf (ref 0–5)
SQUAMOUS EPITHELIAL / LPF: NONE SEEN
Specific Gravity, Urine: 1.02 (ref 1.005–1.030)
pH: 5 (ref 5.0–8.0)

## 2016-10-17 LAB — TROPONIN I

## 2016-10-17 LAB — LIPASE, BLOOD: LIPASE: 20 U/L (ref 11–51)

## 2016-10-17 LAB — ACETAMINOPHEN LEVEL

## 2016-10-17 LAB — ETHANOL

## 2016-10-17 MED ORDER — FAMOTIDINE 40 MG PO TABS: 40.0000 mg | ORAL_TABLET | Freq: Every evening | ORAL | 0 refills | Status: DC

## 2016-10-17 MED ORDER — SUCRALFATE 1 G PO TABS: 1.0000 g | ORAL_TABLET | Freq: Two times a day (BID) | ORAL | 0 refills | Status: DC

## 2016-10-17 MED ORDER — GI COCKTAIL ~~LOC~~
30.0000 mL | Freq: Once | ORAL | Status: AC
  Administered 2016-10-17: 30 mL via ORAL
  Filled 2016-10-17: qty 30

## 2016-10-17 MED ORDER — SUCRALFATE 1 G PO TABS
1.0000 g | ORAL_TABLET | Freq: Once | ORAL | Status: AC
  Administered 2016-10-17: 1 g via ORAL
  Filled 2016-10-17: qty 1

## 2016-10-17 NOTE — ED Provider Notes (Signed)
[facility] Emergency Department Provider Note   ____________________________________________   First MD Initiated Contact with Patient 10/17/16 0034     (approximate)  I have reviewed the triage vital signs and the nursing notes.   HISTORY  Chief Complaint Suicidal and Abdominal Pain    HPI Veronica Ochoa is a 26 y.o. female who comes into the hospital today with some abdominal pain. The patient reports it has been hurting all day. She states that it's in her upper abdomen. The pain as cramping. The patient had not been taking anything for pain at home. She reports that she has some nausea but denies any vomiting. She's never had this pain before. The patient denies any chest pain or shortness of breath. She rates her pain a 9 out of 10 in intensity at this time. She did have a bowel movement before coming in and reports it was normal. She is also been urinating well.The patient reports that her boyfriend states she was suicidal. She states that they had an argument and she became emotional. She took a bunch of ibuprofen last night around 11 PM. She doesn't remember how much but she states that she is not really suicidal. The patient reports she is really here for evaluation of her abdominal pain.   History reviewed. No pertinent past medical history.  There are no active problems to display for this patient.   History reviewed. No pertinent surgical history.  Prior to Admission medications   Medication Sig Start Date End Date Taking? Authorizing Provider  famotidine (PEPCID) 40 MG tablet Take 1 tablet (40 mg total) by mouth every evening. 10/17/16 10/17/17  Rebecka Apley, MD  sucralfate (CARAFATE) 1 g tablet Take 1 tablet (1 g total) by mouth 2 (two) times daily. 10/17/16   Rebecka Apley, MD  sulfamethoxazole-trimethoprim (BACTRIM DS,SEPTRA DS) 800-160 MG tablet Take 1 tablet by mouth 2 (two) times daily. 06/07/15   Joni Reining, PA-C    triamcinolone cream (KENALOG) 0.1 % Apply 1 application topically 2 (two) times daily. 03/31/16   Tommi Rumps, PA-C    Allergies Morphine and related  No family history on file.  Social History Social History  Substance Use Topics  . Smoking status: Never Smoker  . Smokeless tobacco: Never Used  . Alcohol use Yes     Comment: occasional    Review of Systems Constitutional: No fever/chills Eyes: No visual changes. ENT: No sore throat. Cardiovascular: Denies chest pain. Respiratory: Denies shortness of breath. Gastrointestinal:  abdominal pain.   nausea, no vomiting.  No diarrhea.  No constipation. Genitourinary: Negative for dysuria. Musculoskeletal: Negative for back pain. Skin: Negative for rash. Neurological: Negative for headaches, focal weakness or numbness.  10-point ROS otherwise negative.  ____________________________________________   PHYSICAL EXAM:  VITAL SIGNS: ED Triage Vitals  Enc Vitals Group     BP 10/16/16 2301 (!) 157/81     Pulse Rate 10/16/16 2301 92     Resp 10/16/16 2301 16     Temp 10/16/16 2301 98.5 F (36.9 C)     Temp Source 10/16/16 2301 Oral     SpO2 10/16/16 2301 98 %     Weight 10/16/16 2302 210 lb (95.3 kg)     Height 10/16/16 2302 5\' 7"  (1.702 m)     Head Circumference --      Peak Flow --      Pain Score 10/16/16 2308 10     Pain Loc --  Pain Edu? --      Excl. in GC? --     Constitutional: Alert and oriented. Well appearing and in mild distress. Eyes: Conjunctivae are normal. PERRL. EOMI. Head: Atraumatic. Nose: No congestion/rhinnorhea. Mouth/Throat: Mucous membranes are moist.  Oropharynx non-erythematous. Cardiovascular: Normal rate, regular rhythm. Grossly normal heart sounds.  Good peripheral circulation. Respiratory: Normal respiratory effort.  No retractions. Lungs CTAB. Gastrointestinal: Soft With some epigastric and right upper quadrant tenderness to palpation. No distention. Positive bowel  sounds Musculoskeletal: No lower extremity tenderness nor edema.  Neurologic:  Normal speech and language. Skin:  Skin is warm, dry and intact.  Psychiatric: Mood and affect are normal.   ____________________________________________   LABS (all labs ordered are listed, but only abnormal results are displayed)  Labs Reviewed  COMPREHENSIVE METABOLIC PANEL - Abnormal; Notable for the following:       Result Value   ALT 13 (*)    All other components within normal limits  ACETAMINOPHEN LEVEL - Abnormal; Notable for the following:    Acetaminophen (Tylenol), Serum <10 (*)    All other components within normal limits  CBC - Abnormal; Notable for the following:    Hemoglobin 11.9 (*)    HCT 34.6 (*)    RDW 14.7 (*)    All other components within normal limits  URINALYSIS, COMPLETE (UACMP) WITH MICROSCOPIC - Abnormal; Notable for the following:    Color, Urine YELLOW (*)    APPearance TURBID (*)    Protein, ur 30 (*)    Bacteria, UA FEW (*)    All other components within normal limits  ETHANOL  SALICYLATE LEVEL  URINE DRUG SCREEN, QUALITATIVE ([facility] ONLY)  LIPASE, BLOOD  TROPONIN I  POCT PREGNANCY, URINE   ____________________________________________  EKG  none ____________________________________________  RADIOLOGY  none ____________________________________________   PROCEDURES  Procedure(s) performed: None  Procedures  Critical Care performed: No  ____________________________________________   INITIAL IMPRESSION / ASSESSMENT AND PLAN / ED COURSE  Pertinent labs & imaging results that were available during my care of the patient were reviewed by me and considered in my medical decision making (see chart for details).  This is a 26 year old female who comes into the hospital today with some abdominal pain. I will send the patient for an ultrasound as well as a chest x-ray. I will check a lipase and a troponin. The patient will be reassessed.  Clinical  Course as of Oct 17 746  Tue Oct 17, 2016  0154 No active cardiopulmonary disease. DG Chest 2 View [AW]  0154 Normal right upper quadrant ultrasound. US Abdomen Limited RUQ [AW]    Clinical Course User Index [AW] Rebecka ApleyAllison P Webster, MD   Patient was seen by specialist on-call who did not feel that the patient needed inpatient evaluation. He feels like her taking the ibuprofen was attention seeking behavior. The patient is future oriented and at this time is denying suicidal ideation. She will be discharged home to follow-up with [facility] as well as the acute care clinic. The patient likely had this abdominal pain from taking the ibuprofen. I will give her a prescription for Pepcid and Carafate. The patient be discharged home.  ____________________________________________   FINAL CLINICAL IMPRESSION(S) / ED DIAGNOSES  Final diagnoses:  Abdominal pain  Epigastric pain  Acute gastritis without hemorrhage, unspecified gastritis type  Emotional crisis      NEW MEDICATIONS STARTED DURING THIS VISIT:  New Prescriptions   FAMOTIDINE (PEPCID) 40 MG TABLET    Take 1  tablet (40 mg total) by mouth every evening.   SUCRALFATE (CARAFATE) 1 G TABLET    Take 1 tablet (1 g total) by mouth 2 (two) times daily.     Note:  This document was prepared using Dragon voice recognition software and may include unintentional dictation errors.    Rebecka Apley, MD 10/17/16 [phone]

## 2016-10-17 NOTE — Discharge Instructions (Signed)
The abdominal pain that she was having may be from the ibuprofen that she took. Please follow-up with your primary care physician or with the acute care clinic as well as [facility] for some psychiatric counseling.

## 2016-10-17 NOTE — ED Notes (Signed)
Pt belongings given to pt and removed from plastic bag. Pt is dressing.

## 2016-10-17 NOTE — ED Notes (Signed)
Pt returned from X-Ray and Ultrasound. Pt was allowed to talk to her boyfriend on the phone.

## 2016-10-17 NOTE — ED Notes (Signed)
Pt went to X-Ray.  

## 2016-12-18 DIAGNOSIS — F329 Major depressive disorder, single episode, unspecified: Secondary | ICD-10-CM | POA: Insufficient documentation

## 2016-12-18 DIAGNOSIS — R45851 Suicidal ideations: Secondary | ICD-10-CM

## 2016-12-18 HISTORY — DX: Suicidal ideations: R45.851

## 2016-12-18 LAB — HM PAP SMEAR: HM Pap smear: NEGATIVE

## 2017-10-13 ENCOUNTER — Emergency Department
Admission: EM | Admit: 2017-10-13 | Discharge: 2017-10-13 | Disposition: A | Discharge status: Home / Self Care | Payer: BLUE CROSS/BLUE SHIELD | Attending: Emergency Medicine | Admitting: Emergency Medicine

## 2017-10-13 ENCOUNTER — Other Ambulatory Visit: Payer: Self-pay

## 2017-10-13 ENCOUNTER — Encounter: Payer: Self-pay | Admitting: Emergency Medicine

## 2017-10-13 DIAGNOSIS — N9089 Other specified noninflammatory disorders of vulva and perineum: Secondary | ICD-10-CM

## 2017-10-13 DIAGNOSIS — N898 Other specified noninflammatory disorders of vagina: Secondary | ICD-10-CM | POA: Diagnosis present

## 2017-10-13 DIAGNOSIS — Z79899 Other long term (current) drug therapy: Secondary | ICD-10-CM | POA: Diagnosis not present

## 2017-10-13 NOTE — Discharge Instructions (Signed)
Keep the area clean & dry. Apply the barrier cream or diaper rash cream as needed.

## 2017-10-13 NOTE — ED Triage Notes (Signed)
States vaginal itching since yesterday. States had gyn exam at MD office yesterday and thinks she has an allergic reaction to latex. States had the same experience when she forgot to let care provider know she was allergic to latex.

## 2017-10-13 NOTE — ED Notes (Signed)

## 2017-10-14 NOTE — ED Provider Notes (Signed)
[facility] Emergency Department Provider Note ____________________________________________  Time seen: 1508  I have reviewed the triage vital signs and the nursing notes.  HISTORY  Chief Complaint  Allergic Reaction  HPI Veronica Ochoa is a 27 y.o. female resents herself to the ED for vaginal irritation and itching since yesterday.  Patient describes having a gynecological exam is today at her primary MDs office.  She believes she had an allergic reaction to contact with latex gloves during the exam.  She has a history of latex sensitivity and reports a previous, similar episode when she was failed to notify a provider that she had a latex allergy.  She denies any vaginal discharge, frequency, urgency, or dysuria.  She localizes irritation to the vaginal opening.  History reviewed. No pertinent past medical history.  There are no active problems to display for this patient.  History reviewed. No pertinent surgical history.  Prior to Admission medications   Medication Sig Start Date End Date Taking? Authorizing Provider  famotidine (PEPCID) 40 MG tablet Take 1 tablet (40 mg total) by mouth every evening. 10/17/16 10/17/17  Rebecka ApleyWebster, Allison P, MD  sucralfate (CARAFATE) 1 g tablet Take 1 tablet (1 g total) by mouth 2 (two) times daily. 10/17/16   Rebecka ApleyWebster, Allison P, MD  sulfamethoxazole-trimethoprim (BACTRIM DS,SEPTRA DS) 800-160 MG tablet Take 1 tablet by mouth 2 (two) times daily. 06/07/15   Joni ReiningSmith, Ronald K, PA-C  triamcinolone cream (KENALOG) 0.1 % Apply 1 application topically 2 (two) times daily. 03/31/16   Tommi RumpsSummers, Rhonda L, PA-C    Allergies Latex and Morphine and related  No family history on file.  Social History Social History   Tobacco Use  . Smoking status: Never Smoker  . Smokeless tobacco: Never Used  Substance Use Topics  . Alcohol use: Yes    Comment: occasional  . Drug use: No    Review of Systems  Constitutional: Negative for  fever. Cardiovascular: Negative for chest pain. Respiratory: Negative for shortness of breath. Gastrointestinal: Negative for abdominal pain, vomiting and diarrhea. Genitourinary: Negative for dysuria.  Vulvar irritation as above. Musculoskeletal: Negative for back pain. Skin: Negative for rash. Neurological: Negative for headaches, focal weakness or numbness. ____________________________________________  PHYSICAL EXAM:  VITAL SIGNS: ED Triage Vitals  Enc Vitals Group     BP 10/13/17 1331 (!) 143/80     Pulse Rate 10/13/17 1331 76     Resp 10/13/17 1331 18     Temp 10/13/17 1331 98.7 F (37.1 C)     Temp Source 10/13/17 1331 Oral     SpO2 10/13/17 1331 98 %     Weight 10/13/17 1332 200 lb (90.7 kg)     Height 10/13/17 1332 5\' 7"  (1.702 m)     Head Circumference --      Peak Flow --      Pain Score 10/13/17 1332 6     Pain Loc --      Pain Edu? --      Excl. in GC? --     Constitutional: Alert and oriented. Well appearing and in no distress. Head: Normocephalic and atraumatic. Cardiovascular: Normal rate, regular rhythm Respiratory: Normal respiratory effort. GU: Normal external genitalia.  Patient with some mild irritation noted to the labia minora at the introitus. Skin:  Skin is warm, dry and intact. No rash noted. Psychiatric: Mood and affect are normal. Patient exhibits appropriate insight and judgment. ____________________________________________  PROCEDURES  Procedures White petrolatum 71.75% - topically ____________________________________________  INITIAL IMPRESSION /  ASSESSMENT AND PLAN / ED COURSE  Patient with a ED evaluation of some sudden vulvar irritation.  Patient is under the impression that her symptoms were caused by an allergic contact dermatitis latex gloves.  I advised the patient that latex has been at all but removed from nonsterile and sterile gloves in clinical office now patient likely has some mild dermatitis that may be yeast vaginitis.   She admits to having a negative wet prep yesterday for any yeast.  She is advised to continue to apply the petroleum ointment as a barrier cream.  She may utilize over-the-counter external antifungal cream as needed.  She will follow-up with primary provider as needed. ____________________________________________  FINAL CLINICAL IMPRESSION(S) / ED DIAGNOSES  Final diagnoses:  Vulvar irritation      Karmen Stabs, Charlesetta Ivory, PA-C 10/14/17 0034    Merrily Brittle, MD 10/14/17 1453

## 2018-08-28 NOTE — L&D Delivery Note (Signed)
Delivery Summary for [employer]  Labor Events:   Preterm labor: No data found  Rupture date: No data found  Rupture time: No data found  Rupture type: No data found  Fluid Color: No data found  Induction: No data found  Augmentation: No data found  Complications: No data found  Cervical ripening: No data found No data found   No data found     Delivery:   Episiotomy: No data found  Lacerations: No data found  Repair suture: No data found  Repair # of packets: No data found  Blood loss (ml): 175   Information for the patient's newborn:  Ryeleigh, Santore [[id number]]    Delivery 08/16/2019 5:25 PM by  Vaginal, Spontaneous Sex:  female Gestational Age: [redacted]w[redacted]d Delivery Clinician:   Living?:         APGARS  One minute Five minutes Ten minutes  Skin color:        Heart rate:        Grimace:        Muscle tone:        Breathing:        Totals: 0  0      Presentation/position:      Resuscitation:   Cord information:    Disposition of cord blood:     Blood gases sent?  Complications:   Placenta: Delivered:       appearance Newborn Measurements: Weight: 11 oz (312 g)  Height: 10"  Head circumference:    Chest circumference:    Other providers:    Additional  information: Forceps:   Vacuum:   Breech:   Observed anomalies       Delivery Note At 5:25 PM a non-viable female was delivered via Vaginal, Spontaneous en caul with placenta.  APGAR: 0, 0; weight 316 grams.  Fetus appeared grossly normal. No apparent odor present, amniotic fluid clear upon entry of sac. Placenta status: Cord: 3 vessels with the following complications:  Body cord present.     Anesthesia: IV Stadol Episiotomy: None Lacerations: None Suture Repair: None Est. Blood Loss (mL): 175  Mom to remain postpartum on birthing suite.  Baby to remain in room with mother for now. Will later send to [facility] after maternal discharge.  Mother held infant for several minutes.  Will continue to allow  family to bond for now.  Patient's mother and FOB present at time of delivery.   Rubie Maid, MD 08/16/2019, 5:47 PM

## 2018-11-07 IMAGING — US US ABDOMEN LIMITED
1 series · 14 of 25 positions shown · non-contrast
Comparison: None.

CLINICAL DATA: Abdominal pain

EXAM:
US ABDOMEN LIMITED - RIGHT UPPER QUADRANT

[Series 1: us abdomen limited · 0.20mm/px · 14 of 41 slices shown]
[im 1/41]
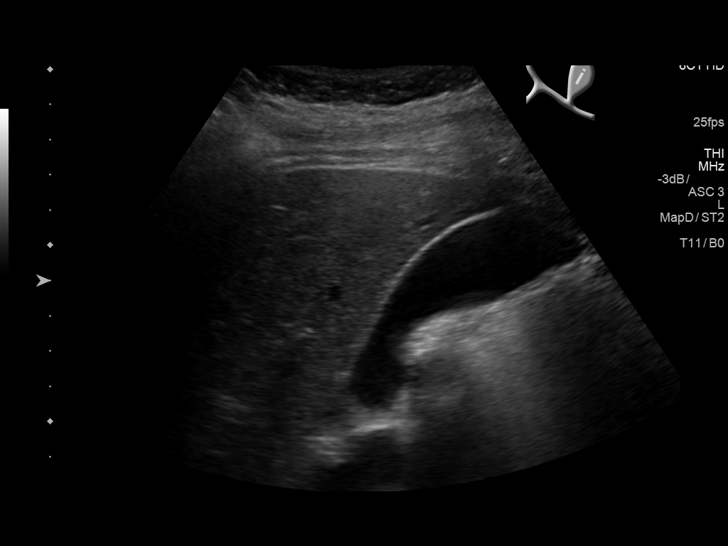
[im 4/41]
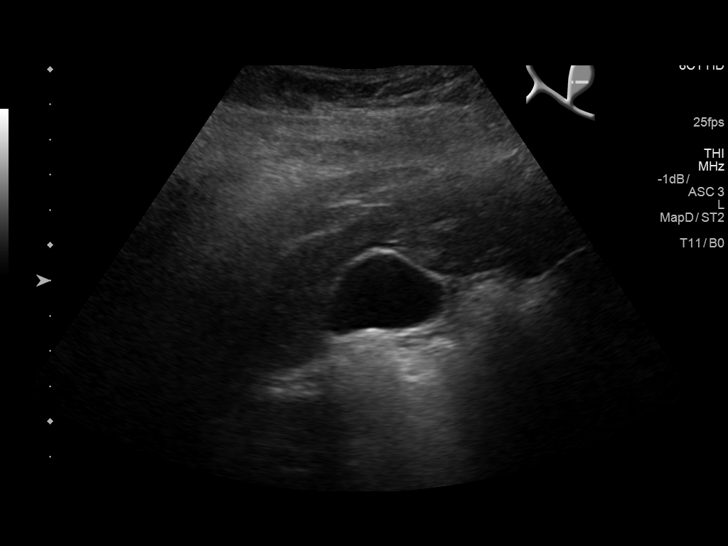
[im 7/41]
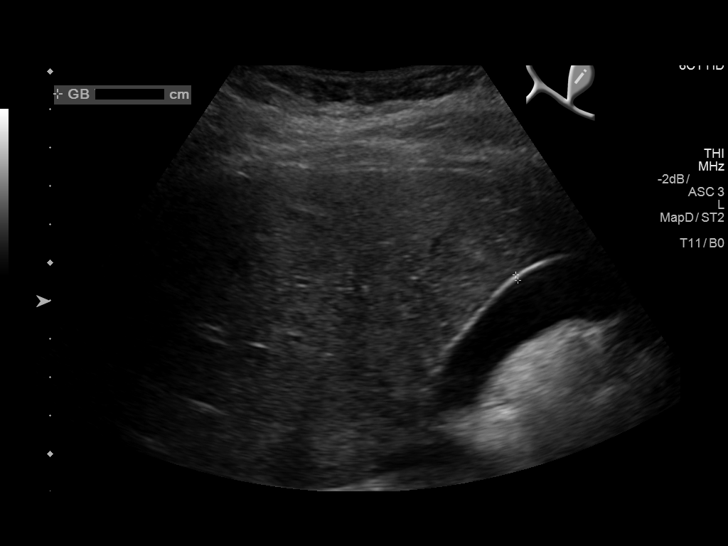
[im 11/41]
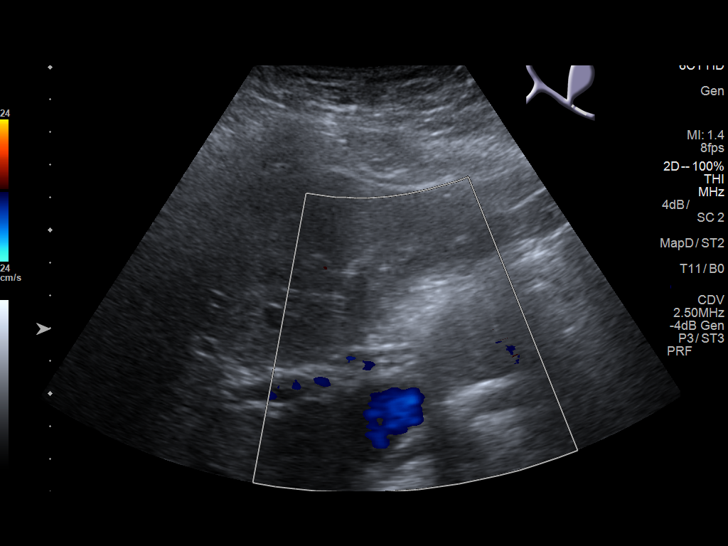
[im 14/41]
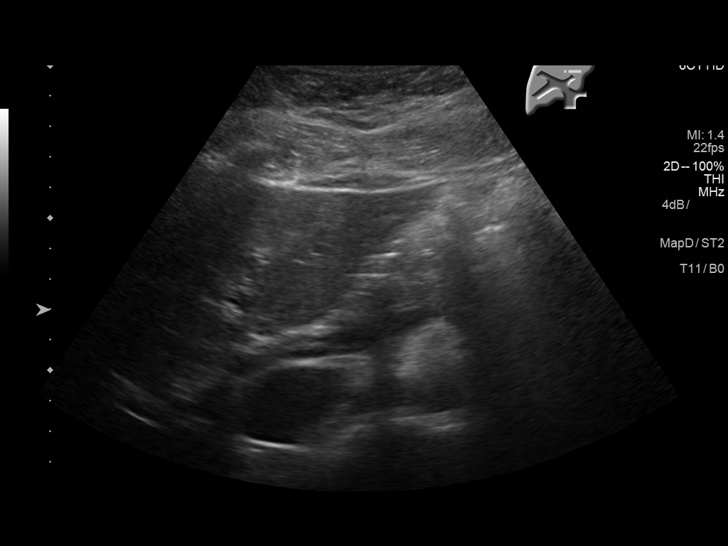
[im 16/41]
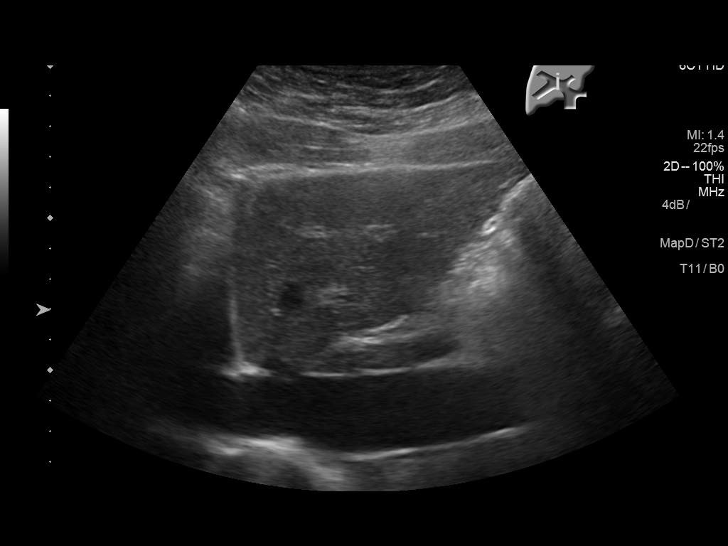
[im 19/41]
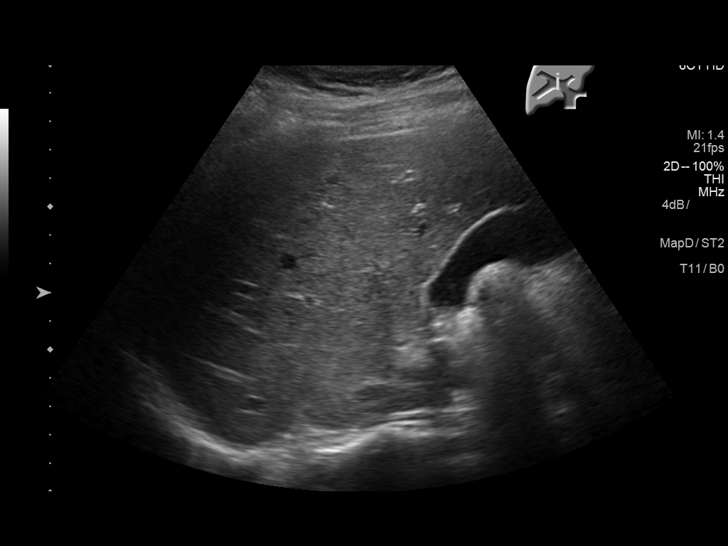
[im 22/41]
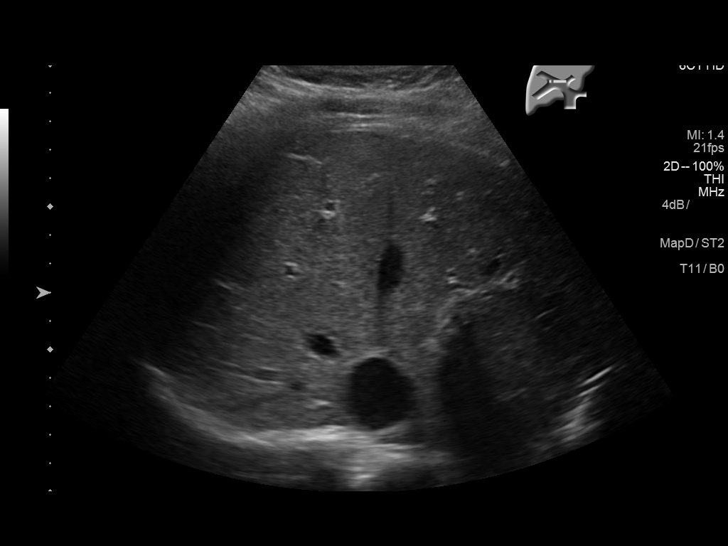
[im 26/41]
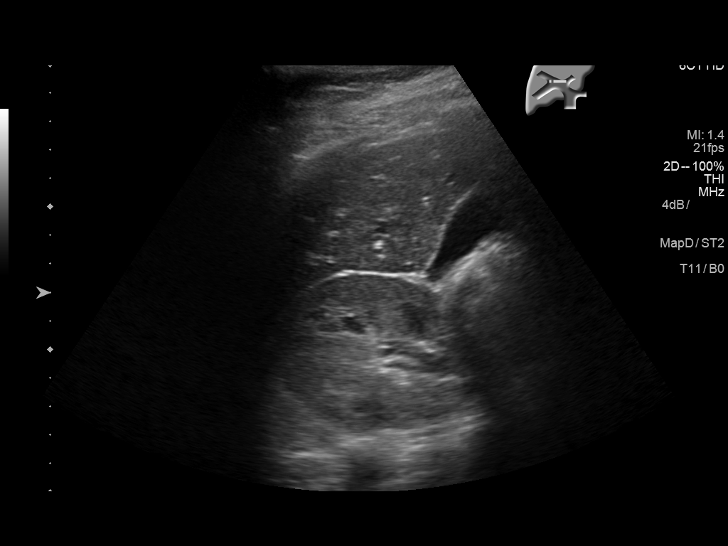
[im 27/41]
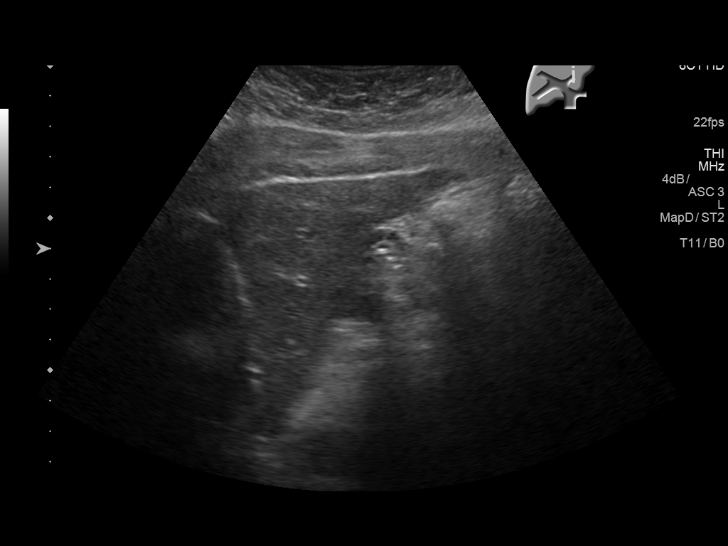
[im 31/41]
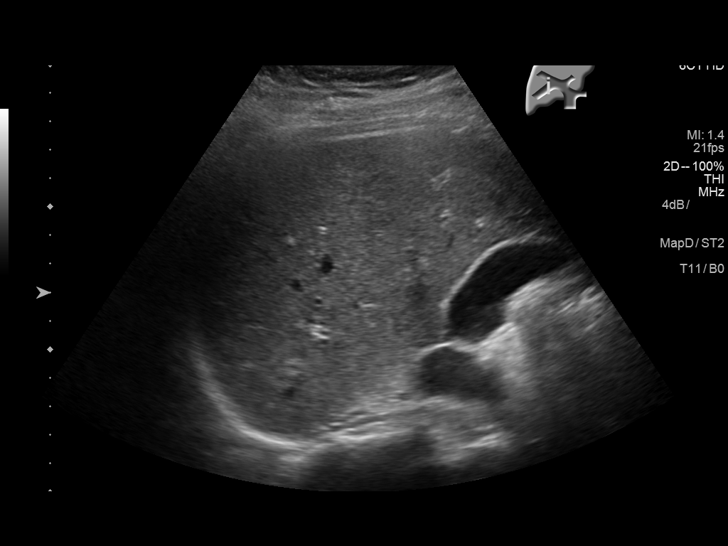
[im 34/41]
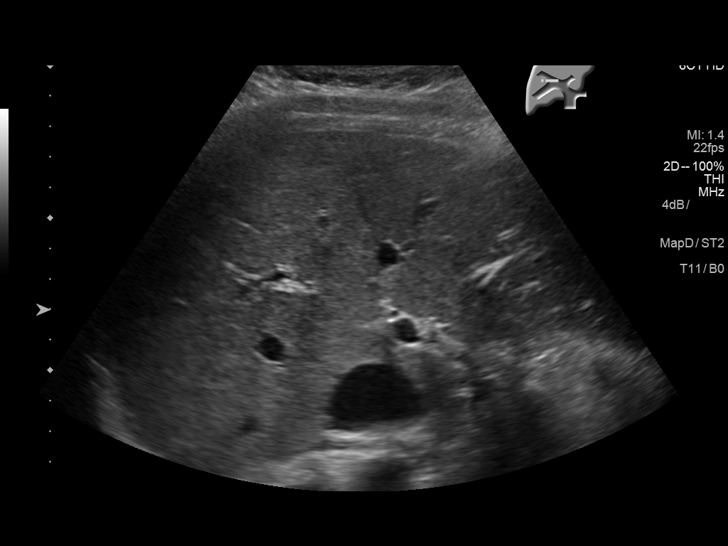
[im 37/41]
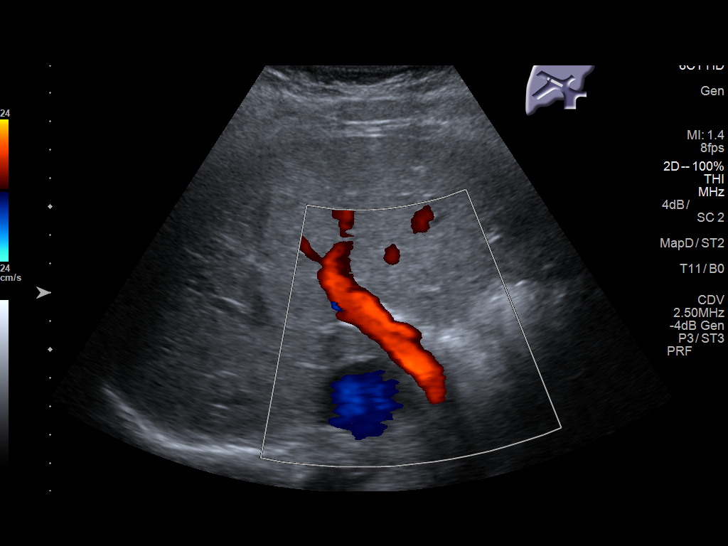
[im 41/41]
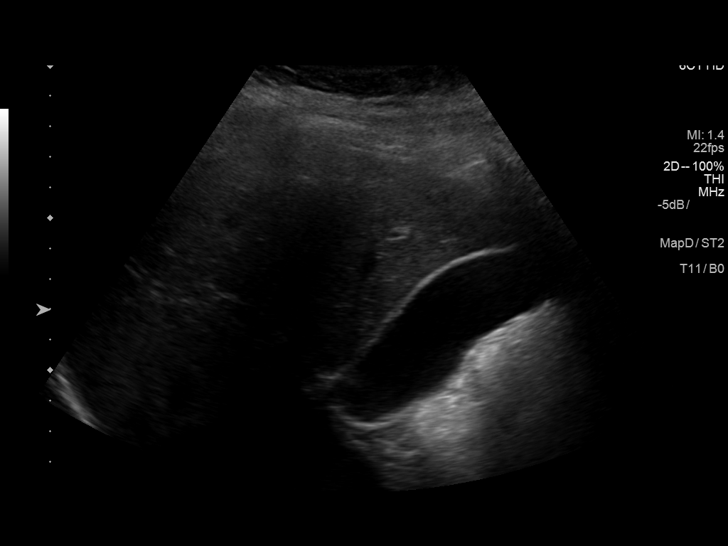

[14 of 25 positions shown; findings below may reference images not displayed]

FINDINGS: Gallbladder:

No gallstones or wall thickening visualized. No sonographic Murphy
sign noted by sonographer.

Common bile duct:

Diameter: 2.4 mm, normal

Liver:

No focal lesion identified. Within normal limits in parenchymal
echogenicity.
IMPRESSION: Normal right upper quadrant ultrasound.

## 2018-11-28 ENCOUNTER — Other Ambulatory Visit (HOSPITAL_COMMUNITY): Payer: Self-pay | Admitting: Family Medicine

## 2018-11-28 LAB — WET PREP FOR TRICH, YEAST, CLUE
Trichomonas Exam: NEGATIVE
Yeast Exam: NEGATIVE

## 2018-11-28 LAB — HM HIV SCREENING LAB: HM HIV Screening: NEGATIVE

## 2019-04-01 DIAGNOSIS — Z6839 Body mass index (BMI) 39.0-39.9, adult: Secondary | ICD-10-CM

## 2019-04-01 DIAGNOSIS — E669 Obesity, unspecified: Secondary | ICD-10-CM

## 2019-04-01 DIAGNOSIS — F4325 Adjustment disorder with mixed disturbance of emotions and conduct: Secondary | ICD-10-CM

## 2019-04-01 DIAGNOSIS — F329 Major depressive disorder, single episode, unspecified: Secondary | ICD-10-CM

## 2019-04-01 DIAGNOSIS — R45851 Suicidal ideations: Secondary | ICD-10-CM

## 2019-04-02 ENCOUNTER — Other Ambulatory Visit: Payer: Self-pay

## 2019-04-02 ENCOUNTER — Encounter: Payer: Self-pay | Admitting: Physician Assistant

## 2019-04-02 ENCOUNTER — Ambulatory Visit: Payer: Self-pay | Admitting: Physician Assistant

## 2019-04-02 DIAGNOSIS — Z113 Encounter for screening for infections with a predominantly sexual mode of transmission: Secondary | ICD-10-CM

## 2019-04-02 LAB — WET PREP FOR TRICH, YEAST, CLUE
Trichomonas Exam: NEGATIVE
Yeast Exam: NEGATIVE

## 2019-04-02 NOTE — Progress Notes (Signed)
    STI clinic/screening visit  Subjective:  Veronica Ochoa is a 28 y.o. female being seen today for an STI screening visit. The patient reports they do not have symptoms.  Patient has the following medical conditions:   Patient Active Problem List   Diagnosis Date Noted  . 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 Eczema 05/27/2015  . Obesity, unspecified 05/27/2015     Chief Complaint  Patient presents with  . SEXUALLY TRANSMITTED DISEASE    HPI  Patient reports that she does not have any symptoms and would like a screening today.  See flowsheet for further details and programmatic requirements.    The following portions of the patient's history were reviewed and updated as appropriate: allergies, current medications, past medical history, past social history, past surgical history and problem list.  Objective:  There were no vitals filed for this visit.  Physical Exam Constitutional:      General: She is not in acute distress.    Appearance: Normal appearance.  HENT:     Head: Normocephalic and atraumatic.     Mouth/Throat:     Mouth: Mucous membranes are moist.     Pharynx: Oropharynx is clear. No oropharyngeal exudate or posterior oropharyngeal erythema.  Neck:     Musculoskeletal: Neck supple.  Pulmonary:     Effort: Pulmonary effort is normal.  Abdominal:     Palpations: Abdomen is soft. There is no mass.     Tenderness: There is no abdominal tenderness. There is no guarding or rebound.  Genitourinary:    General: Normal vulva.     Rectum: Normal.     Comments: External genitalia/pubic area without nits, lice, edema, erythema, lesions or inguinal adenopathy. Vagina with normal mucosa and discharge, pH=4.5. Cervix without visible lesions.  Uterus firm, mobile, nt, no masses, no CMT, no adnexal tenderness or fullness. Lymphadenopathy:     Cervical: No cervical  adenopathy.  Skin:    General: Skin is warm and dry.     Findings: No bruising, erythema, lesion or rash.  Neurological:     Mental Status: She is alert and oriented to person, place, and time.  Psychiatric:        Mood and Affect: Mood normal.        Behavior: Behavior normal.        Thought Content: Thought content normal.        Judgment: Judgment normal.       Assessment and Plan:  Veronica Ochoa is a 28 y.o. female presenting to the [facility] Department for STI screening  1. Screening for STD (sexually transmitted disease) Patient is without symptoms today. No treatment indicated after reviewing wet mount. Rec condoms with all sex Await test results.  Counseled that RN will call if needs to RTC for any treatment after results are back. - WET PREP FOR TRICH, YEAST, CLUE - Gonococcus culture - [place] [facility] - Syphilis Serology, [facility]     No follow-ups on file.  No future appointments.  Jerene Dilling, PA

## 2019-04-07 LAB — GONOCOCCUS CULTURE

## 2019-04-30 ENCOUNTER — Other Ambulatory Visit: Payer: Self-pay

## 2019-04-30 ENCOUNTER — Emergency Department
Admission: EM | Admit: 2019-04-30 | Discharge: 2019-04-30 | Disposition: A | Discharge status: Home / Self Care | Payer: Medicaid Other | Attending: Emergency Medicine | Admitting: Emergency Medicine

## 2019-04-30 DIAGNOSIS — O23591 Infection of other part of genital tract in pregnancy, first trimester: Secondary | ICD-10-CM | POA: Insufficient documentation

## 2019-04-30 DIAGNOSIS — B9689 Other specified bacterial agents as the cause of diseases classified elsewhere: Secondary | ICD-10-CM | POA: Insufficient documentation

## 2019-04-30 DIAGNOSIS — Z3491 Encounter for supervision of normal pregnancy, unspecified, first trimester: Secondary | ICD-10-CM

## 2019-04-30 DIAGNOSIS — Z79899 Other long term (current) drug therapy: Secondary | ICD-10-CM | POA: Diagnosis not present

## 2019-04-30 DIAGNOSIS — Z3A01 Less than 8 weeks gestation of pregnancy: Secondary | ICD-10-CM | POA: Insufficient documentation

## 2019-04-30 DIAGNOSIS — B3731 Acute candidiasis of vulva and vagina: Secondary | ICD-10-CM

## 2019-04-30 DIAGNOSIS — B373 Candidiasis of vulva and vagina: Secondary | ICD-10-CM

## 2019-04-30 LAB — URINALYSIS, COMPLETE (UACMP) WITH MICROSCOPIC
Bacteria, UA: NONE SEEN
Bilirubin Urine: NEGATIVE
Glucose, UA: NEGATIVE mg/dL
Hgb urine dipstick: NEGATIVE
Ketones, ur: NEGATIVE mg/dL
Nitrite: NEGATIVE
Protein, ur: 30 mg/dL — AB
Specific Gravity, Urine: 1.028 (ref 1.005–1.030)
pH: 6 (ref 5.0–8.0)

## 2019-04-30 LAB — WET PREP, GENITAL
Clue Cells Wet Prep HPF POC: NONE SEEN
Sperm: NONE SEEN
Trich, Wet Prep: NONE SEEN

## 2019-04-30 LAB — POCT PREGNANCY, URINE: Preg Test, Ur: POSITIVE — AB

## 2019-04-30 MED ORDER — CLOTRIMAZOLE 3 2 % VA CREA: 1.0000 | TOPICAL_CREAM | Freq: Every day | VAGINAL | 0 refills | Status: DC

## 2019-04-30 MED ORDER — PRENATAL VITAMINS 28-0.8 MG PO TABS: 1.0000 | ORAL_TABLET | Freq: Every day | ORAL | 0 refills | Status: DC

## 2019-04-30 NOTE — ED Notes (Signed)
Patient states she thinks she is approx [redacted] weeks pregnant. Reports last period 7/24. Patient denies any OBGYN care so far. States this is her first pregnancy. Patient denies any new partners and reports [facility] check [facility] 1 month ago but states she wouldn't mind being rechecked.

## 2019-04-30 NOTE — ED Notes (Signed)
Patient ambulatory to lobby with steady gait and NAD noted. Verbalized understanding of discharge instructions and follow-up care.  

## 2019-04-30 NOTE — ED Triage Notes (Signed)
Reports vaginal itching and irritation X 3 weeks. Recently found out she was pregnant and stopped taking medications that PCP prescribed her for same problem due to unknown safety during pregnancy. Re[ports "normal pregnancy discharge". Pt alert and oriented X4, cooperative, RR even and unlabored, color WNL. Pt in NAD.

## 2019-04-30 NOTE — ED Provider Notes (Signed)
[facility] Emergency Department Provider Note  ____________________________________________  Time seen: Approximately 12:54 PM  I have reviewed the triage vital signs and the nursing notes.   HISTORY  Chief Complaint Vaginal Discharge    HPI Veronica Ochoa is a 28 y.o. female that presents to the emergency department for evaluation of vaginal itching for about 3 weeks, worse recently.  Patient does have some vaginal discharge that is not different than her usual.  Patient states that she has a history of bacterial vaginosis and yeast and this feels similar.  Patient is [redacted] weeks pregnant. LMP was 7/24. She does not yet have OB/GYN.   Patient tested negative for STDs about 1 month ago. No abdominal pain, vaginal bleeding.  History reviewed. No pertinent past medical history.  Patient Active Problem List   Diagnosis Date Noted  . 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 Eczema 05/27/2015  . Obesity, unspecified 05/27/2015    History reviewed. No pertinent surgical history.  Prior to Admission medications   Medication Sig Start Date End Date Taking? Authorizing Provider  clotrimazole (CLOTRIMAZOLE 3) 2 % vaginal cream Place 1 Applicatorful vaginally at bedtime. 04/30/19   Enid Derry, PA-C  famotidine (PEPCID) 40 MG tablet Take 1 tablet (40 mg total) by mouth every evening. 10/17/16 10/17/17  Rebecka Apley, MD  Prenatal Vit-Fe Fumarate-FA (PRENATAL VITAMINS) 28-0.8 MG TABS Take 1 Dose by mouth daily. 04/30/19   Enid Derry, PA-C  sucralfate (CARAFATE) 1 g tablet Take 1 tablet (1 g total) by mouth 2 (two) times daily. 10/17/16   Rebecka Apley, MD  sulfamethoxazole-trimethoprim (BACTRIM DS,SEPTRA DS) 800-160 MG tablet Take 1 tablet by mouth 2 (two) times daily. 06/07/15   Joni Reining, PA-C  triamcinolone cream (KENALOG) 0.1 % Apply 1 application topically 2  (two) times daily. 03/31/16   Tommi Rumps, PA-C    Allergies Vancomycin, Latex, and Morphine and related  Family History  Problem Relation Age of Onset  . Diabetes Father   . Arthritis Maternal Grandmother   . Diabetes Maternal Grandmother   . Asthma Maternal Grandmother     Social History Social History   Tobacco Use  . Smoking status: Never Smoker  . Smokeless tobacco: Never Used  Substance Use Topics  . Alcohol use: Yes    Comment: occasional  . Drug use: No     Review of Systems  Constitutional: No fever/chills Gastrointestinal: No abdominal pain.  No nausea, no vomiting.  Genitourinary: Negative for dysuria. Musculoskeletal: Negative for musculoskeletal pain. Skin: Negative for rash, abrasions, lacerations, ecchymosis.   ____________________________________________   PHYSICAL EXAM:  VITAL SIGNS: ED Triage Vitals  Enc Vitals Group     BP 04/30/19 1051 134/83     Pulse Rate 04/30/19 1051 88     Resp 04/30/19 1051 18     Temp 04/30/19 1051 98.5 F (36.9 C)     Temp Source 04/30/19 1051 Oral     SpO2 04/30/19 1051 100 %     Weight 04/30/19 1052 240 lb (108.9 kg)     Height 04/30/19 1052 5\' 7"  (1.702 m)     Head Circumference --      Peak Flow --      Pain Score 04/30/19 1052 0     Pain Loc --      Pain Edu? --      Excl. in [place]? --  Constitutional: Alert and oriented. Well appearing and in no acute distress. Eyes: Conjunctivae are normal. PERRL. EOMI. Head: Atraumatic. ENT:      Ears:      Nose: No congestion/rhinnorhea.      Mouth/Throat: Mucous membranes are moist.  Neck: No stridor.  Cardiovascular: Normal rate, regular rhythm.  Good peripheral circulation. Respiratory: Normal respiratory effort without tachypnea or retractions. Lungs CTAB. Good air entry to the bases with no decreased or absent breath sounds. Gastrointestinal: Bowel sounds 4 quadrants. Soft and nontender to palpation. No guarding or rigidity. No palpable masses. No  distention. No CVA tenderness. Genitourinary: No external rashes or lesions. White vaginal discharge. No cervical motion tenderness.  Musculoskeletal: Full range of motion to all extremities. No gross deformities appreciated. Neurologic:  Normal speech and language. No gross focal neurologic deficits are appreciated.  Skin:  Skin is warm, dry and intact. No rash noted. Psychiatric: Mood and affect are normal. Speech and behavior are normal. Patient exhibits appropriate insight and judgement.   ____________________________________________   LABS (all labs ordered are listed, but only abnormal results are displayed)  Labs Reviewed  WET PREP, GENITAL - Abnormal; Notable for the following components:      Result Value   Yeast Wet Prep HPF POC PRESENT (*)    WBC, Wet Prep HPF POC FEW (*)    All other components within normal limits  URINALYSIS, COMPLETE (UACMP) WITH MICROSCOPIC - Abnormal; Notable for the following components:   Color, Urine AMBER (*)    APPearance HAZY (*)    Protein, ur 30 (*)    Leukocytes,Ua MODERATE (*)    All other components within normal limits  POCT PREGNANCY, URINE - Abnormal; Notable for the following components:   Preg Test, Ur POSITIVE (*)    All other components within normal limits  GC/CHLAMYDIA PROBE AMP  URINE CULTURE   ____________________________________________  EKG   ____________________________________________  RADIOLOGY  No results found.  ____________________________________________    PROCEDURES  Procedure(s) performed:    Procedures    Medications - No data to display   ____________________________________________   INITIAL IMPRESSION / ASSESSMENT AND PLAN / ED COURSE  Pertinent labs & imaging results that were available during my care of the patient were reviewed by me and considered in my medical decision making (see chart for details).  Review of the [place] CSRS was performed in accordance of the [place] prior to  dispensing any controlled drugs.   Patient's diagnosis is consistent with yeast vaginitis.  Wet prep consistent with yeast.  Urinalysis shows some white blood cells and leukocytes but no bacteria.  Urine was sent for culture.  Patient is to follow up with ob/gyn as directed. Patient denies any abdominal pain or vaginal bleeding. Patient was given prescription for prenatal vitamins and clotrimazole vaginal cream.  Referral to gynecology was given.  Patient is given ED precautions to return to the ED for any worsening or new symptoms.  GRACELYNNE BENEDICT was evaluated in Emergency Department on 04/30/2019 for the symptoms described in the history of present illness. She was evaluated in the context of the global COVID-19 pandemic, which necessitated consideration that the patient might be at risk for infection with the SARS-CoV-2 virus that causes COVID-19. Institutional protocols and algorithms that pertain to the evaluation of patients at risk for COVID-19 are in a state of rapid change based on information released by regulatory bodies including the CDC and federal and state organizations. These policies and algorithms were followed during the  patient's care in the ED.   ____________________________________________  FINAL CLINICAL IMPRESSION(S) / ED DIAGNOSES  Final diagnoses:  Yeast vaginitis  First trimester pregnancy      NEW MEDICATIONS STARTED DURING THIS VISIT:  ED Discharge Orders         Ordered    clotrimazole (CLOTRIMAZOLE 3) 2 % vaginal cream  Daily at bedtime,   Status:  Discontinued     04/30/19 1339    Prenatal Vit-Fe Fumarate-FA (PRENATAL VITAMINS) 28-0.8 MG TABS  Daily,   Status:  Discontinued     04/30/19 1340    clotrimazole (CLOTRIMAZOLE 3) 2 % vaginal cream  Daily at bedtime     04/30/19 1348    Prenatal Vit-Fe Fumarate-FA (PRENATAL VITAMINS) 28-0.8 MG TABS  Daily     04/30/19 1348              This chart was dictated using voice recognition software/Dragon.  Despite best efforts to proofread, errors can occur which can change the meaning. Any change was purely unintentional.    Enid DerryWagner, Adna Nofziger, PA-C 04/30/19 1413    Emily FilbertWilliams, Jonathan E, MD 04/30/19 1435

## 2019-05-01 ENCOUNTER — Telehealth (HOSPITAL_BASED_OUTPATIENT_CLINIC_OR_DEPARTMENT_OTHER): Payer: Self-pay | Admitting: *Deleted

## 2019-05-01 LAB — URINE CULTURE: Culture: 20000 — AB

## 2019-05-05 ENCOUNTER — Emergency Department
Admission: EM | Admit: 2019-05-05 | Discharge: 2019-05-05 | Disposition: A | Discharge status: Home / Self Care | Payer: Medicaid Other | Attending: Emergency Medicine | Admitting: Emergency Medicine

## 2019-05-05 ENCOUNTER — Other Ambulatory Visit: Payer: Self-pay

## 2019-05-05 ENCOUNTER — Encounter: Payer: Self-pay | Admitting: Emergency Medicine

## 2019-05-05 ENCOUNTER — Emergency Department: Payer: Medicaid Other

## 2019-05-05 DIAGNOSIS — Z3A01 Less than 8 weeks gestation of pregnancy: Secondary | ICD-10-CM | POA: Insufficient documentation

## 2019-05-05 DIAGNOSIS — O208 Other hemorrhage in early pregnancy: Secondary | ICD-10-CM | POA: Diagnosis present

## 2019-05-05 DIAGNOSIS — O4690 Antepartum hemorrhage, unspecified, unspecified trimester: Secondary | ICD-10-CM

## 2019-05-05 DIAGNOSIS — O200 Threatened abortion: Secondary | ICD-10-CM | POA: Insufficient documentation

## 2019-05-05 LAB — COMPREHENSIVE METABOLIC PANEL
ALT: 25 U/L (ref 0–44)
AST: 16 U/L (ref 15–41)
Albumin: 4 g/dL (ref 3.5–5.0)
Alkaline Phosphatase: 45 U/L (ref 38–126)
Anion gap: 6 (ref 5–15)
BUN: 14 mg/dL (ref 6–20)
CO2: 24 mmol/L (ref 22–32)
Calcium: 9.1 mg/dL (ref 8.9–10.3)
Chloride: 106 mmol/L (ref 98–111)
Creatinine, Ser: 0.51 mg/dL (ref 0.44–1.00)
GFR calc Af Amer: >60 mL/min (ref >60)
GFR calc non Af Amer: >60 mL/min (ref >60)
Glucose, Bld: 93 mg/dL (ref 70–99)
Potassium: 3.6 mmol/L (ref 3.5–5.1)
Sodium: 136 mmol/L (ref 135–145)
Total Bilirubin: 0.7 mg/dL (ref 0.3–1.2)
Total Protein: 7.1 g/dL (ref 6.5–8.1)

## 2019-05-05 LAB — URINALYSIS, COMPLETE (UACMP) WITH MICROSCOPIC
Bacteria, UA: NONE SEEN
Bilirubin Urine: NEGATIVE
Glucose, UA: NEGATIVE mg/dL
Hgb urine dipstick: NEGATIVE
Ketones, ur: NEGATIVE mg/dL
Leukocytes,Ua: NEGATIVE
Nitrite: NEGATIVE
Protein, ur: NEGATIVE mg/dL
Specific Gravity, Urine: 1.031 — ABNORMAL HIGH (ref 1.005–1.030)
pH: 5 (ref 5.0–8.0)

## 2019-05-05 LAB — CBC
HCT: 34 % — ABNORMAL LOW (ref 36.0–46.0)
Hemoglobin: 11.3 g/dL — ABNORMAL LOW (ref 12.0–15.0)
MCH: 27.3 pg (ref 26.0–34.0)
MCHC: 33.2 g/dL (ref 30.0–36.0)
MCV: 82.1 fL (ref 80.0–100.0)
Platelets: 291 10*3/uL (ref 150–400)
RBC: 4.14 MIL/uL (ref 3.87–5.11)
RDW: 14.5 % (ref 11.5–15.5)
WBC: 8.5 10*3/uL (ref 4.0–10.5)
nRBC: 0 % (ref 0.0–0.2)

## 2019-05-05 LAB — HCG, QUANTITATIVE, PREGNANCY: hCG, Beta Chain, Quant, S: 74711 m[IU]/mL — ABNORMAL HIGH (ref <5)

## 2019-05-05 LAB — LIPASE, BLOOD: Lipase: 17 U/L (ref 11–51)

## 2019-05-05 NOTE — ED Triage Notes (Signed)
Patient presents to the ED with lower abdominal pain and spotting that started yesterday.  Patient reports she is approx. [redacted] weeks pregnant.  Patient is currently taking keflex for "strep b".

## 2019-05-05 NOTE — Discharge Instructions (Addendum)
Please follow-up with OB/GYN this week for recheck/reevaluation.  Return to the emergency department for any increase in abdominal pain or increase in bleeding, or any other symptom personally concerning to yourself.

## 2019-05-05 NOTE — ED Provider Notes (Signed)
[facility] Emergency Department Provider Note  Time seen: 12:20 PM  I have reviewed the triage vital signs and the nursing notes.   HISTORY  Chief Complaint Vaginal Bleeding    HPI Veronica Ochoa is a [age] y.o. female with no significant past medical history approximately [redacted] weeks pregnant presents to the emergency department for vaginal bleeding and lower abdominal cramping.  According to the patient she is approximately [redacted] weeks pregnant by her last menstrual period, has not yet seen an OB.  Patient states last night she began with lower abdominal cramping and vaginal bleeding.  Continued to have spotting today so she came to the emergency department for evaluation.  Overall the patient appears very well, no acute distress.  Patient denies any recent fever cough congestion or shortness of breath.   History reviewed. No pertinent past medical history.  Patient Active Problem List   Diagnosis Date Noted  . 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 Eczema 05/27/2015  . Obesity, unspecified 05/27/2015    History reviewed. No pertinent surgical history.  Prior to Admission medications   Medication Sig Start Date End Date Taking? Authorizing Provider  clotrimazole (CLOTRIMAZOLE 3) 2 % vaginal cream Place 1 Applicatorful vaginally at bedtime. 04/30/19   Enid DerryWagner, Ashley, PA-C  famotidine (PEPCID) 40 MG tablet Take 1 tablet (40 mg total) by mouth every evening. 10/17/16 10/17/17  Rebecka ApleyWebster, Allison P, MD  Prenatal Vit-Fe Fumarate-FA (PRENATAL VITAMINS) 28-0.8 MG TABS Take 1 Dose by mouth daily. 04/30/19   Enid DerryWagner, Ashley, PA-C  sucralfate (CARAFATE) 1 g tablet Take 1 tablet (1 g total) by mouth 2 (two) times daily. 10/17/16   Rebecka ApleyWebster, Allison P, MD  sulfamethoxazole-trimethoprim (BACTRIM DS,SEPTRA DS) 800-160 MG tablet Take 1 tablet by mouth 2 (two) times daily. 06/07/15   Joni ReiningSmith, Ronald K,  PA-C  triamcinolone cream (KENALOG) 0.1 % Apply 1 application topically 2 (two) times daily. 03/31/16   Tommi RumpsSummers, Rhonda L, PA-C    Allergies  Allergen Reactions  . Vancomycin Hives and Swelling  . Latex Swelling  . Morphine And Related Hives    Family History  Problem Relation Age of Onset  . Diabetes Father   . Arthritis Maternal Grandmother   . Diabetes Maternal Grandmother   . Asthma Maternal Grandmother     Social History Social History   Tobacco Use  . Smoking status: Never Smoker  . Smokeless tobacco: Never Used  Substance Use Topics  . Alcohol use: Yes    Comment: occasional  . Drug use: No    Review of Systems Constitutional: Negative for fever. Cardiovascular: Negative for chest pain. Respiratory: Negative for shortness of breath. Gastrointestinal: Lower abdominal cramping, mild.  Negative for nausea vomiting or diarrhea. Genitourinary: Negative for dysuria or hematuria Musculoskeletal: Negative for musculoskeletal complaints Skin: Negative for skin complaints  Neurological: Negative for headache All other ROS negative  ____________________________________________   PHYSICAL EXAM:  VITAL SIGNS: ED Triage Vitals [05/05/19 1050]  Enc Vitals Group     BP 136/64     Pulse Rate 85     Resp      Temp 98.3 F (36.8 C)     Temp Source Oral     SpO2 100 %     Weight 240 lb (108.9 kg)     Height 5\' 7"  (1.702 m)     Head Circumference      Peak Flow  Pain Score      Pain Loc      Pain Edu?      Excl. in [place]?    Constitutional: Alert and oriented. Well appearing and in no distress. Eyes: Normal exam ENT      Head: Normocephalic and atraumatic      Mouth/Throat: Mucous membranes are moist. Cardiovascular: Normal rate, regular rhythm. Respiratory: Normal respiratory effort without tachypnea nor retractions. Breath sounds are clear  Gastrointestinal: Soft, mild suprapubic tenderness.  No rebound guarding or distention. Musculoskeletal: Nontender  with normal range of motion in all extremities.  Neurologic:  Normal speech and language. No gross focal neurologic deficits  Skin:  Skin is warm, dry and intact.  Psychiatric: Mood and affect are normal.   ____________________________________________   RADIOLOGY  Ultrasound is resulted showing a single live IUP at 6 weeks and 4 days.  ____________________________________________   INITIAL IMPRESSION / ASSESSMENT AND PLAN / ED COURSE  Pertinent labs & imaging results that were available during my care of the patient were reviewed by me and considered in my medical decision making (see chart for details).   Patient presents emergency department for vaginal bleeding lower abdominal cramping since yesterday.  Differential would include threatened miscarriage, subchorionic hemorrhage, ectopic pregnancy.  We will check labs including urinalysis, obtain an ultrasound and continue to closely monitor.  Patient agreeable to plan of care.  Ultrasound has resulted showing a 6-week 4-day IUP.  No other acute findings.  Discussed return precautions and OB follow-up.  Patient agreeable to plan of care.  Veronica Ochoa was evaluated in Emergency Department on 05/05/2019 for the symptoms described in the history of present illness. She was evaluated in the context of the global COVID-19 pandemic, which necessitated consideration that the patient might be at risk for infection with the SARS-CoV-2 virus that causes COVID-19. Institutional protocols and algorithms that pertain to the evaluation of patients at risk for COVID-19 are in a state of rapid change based on information released by regulatory bodies including the CDC and federal and state organizations. These policies and algorithms were followed during the patient's care in the ED.  ____________________________________________   FINAL CLINICAL IMPRESSION(S) / ED DIAGNOSES  Threatened miscarriage    Harvest Dark, MD 05/05/19 1425

## 2019-05-05 NOTE — ED Notes (Signed)
Patient transported to Ultrasound 

## 2019-05-07 ENCOUNTER — Ambulatory Visit (LOCAL_COMMUNITY_HEALTH_CENTER): Payer: Self-pay

## 2019-05-07 ENCOUNTER — Other Ambulatory Visit: Payer: Self-pay

## 2019-05-07 VITALS — BP 103/63 | Ht 67.0 in | Wt 248.5 lb

## 2019-05-07 DIAGNOSIS — Z3201 Encounter for pregnancy test, result positive: Secondary | ICD-10-CM

## 2019-05-07 LAB — PREGNANCY, URINE: Preg Test, Ur: POSITIVE — AB

## 2019-05-07 NOTE — Progress Notes (Signed)
Pt already has appt set with [facility] for prenatal care; pt sent to preadmit. Pt declined PNV from [facility], states she will be purchasing prenatal gummies today.

## 2019-05-08 ENCOUNTER — Telehealth: Payer: Self-pay | Admitting: Emergency Medicine

## 2019-05-08 LAB — GC/CHLAMYDIA PROBE AMP: Neisseria Gonorrhoeae by PCR: POSITIVE — AB

## 2019-05-08 NOTE — Telephone Encounter (Signed)
Called patient to inform of positive gonorrhea and need for treatment.  Left message asking her to call me.

## 2019-05-08 NOTE — Telephone Encounter (Signed)
Patient called me back. She understands she needs to be treated.  She will call [facility] now.  She also understands partner treatement is needed.

## 2019-05-12 ENCOUNTER — Other Ambulatory Visit: Payer: Self-pay

## 2019-05-12 ENCOUNTER — Encounter: Payer: Self-pay | Admitting: Physician Assistant

## 2019-05-12 ENCOUNTER — Ambulatory Visit: Payer: Self-pay | Admitting: Physician Assistant

## 2019-05-12 DIAGNOSIS — A5402 Gonococcal vulvovaginitis, unspecified: Secondary | ICD-10-CM

## 2019-05-12 DIAGNOSIS — Z113 Encounter for screening for infections with a predominantly sexual mode of transmission: Secondary | ICD-10-CM

## 2019-05-12 MED ORDER — AZITHROMYCIN 500 MG PO TABS
1000.0000 mg | ORAL_TABLET | Freq: Once | ORAL | Status: AC
  Administered 2019-05-12: 1000 mg via ORAL

## 2019-05-12 MED ORDER — CEFTRIAXONE SODIUM 250 MG IJ SOLR
250.0000 mg | Freq: Once | INTRAMUSCULAR | Status: AC
  Administered 2019-05-12: 250 mg via INTRAMUSCULAR

## 2019-05-12 NOTE — Progress Notes (Signed)
Here today for follow up medication regarding per patient had +GC results from 05/05/2019 [facility] ED visit. Hal Morales, RN Patient treated per provider orders. Hal Morales, RN

## 2019-05-12 NOTE — Progress Notes (Signed)
    STI clinic/screening visit  Subjective:  Veronica Ochoa is a 28 y.o. female being seen today for an STI screening visit. The patient reports they do have symptoms.  Patient has the following medical conditions:   Patient Active Problem List   Diagnosis Date Noted  . 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 Eczema 05/27/2015  . Obesity, unspecified 05/27/2015     Chief Complaint  Patient presents with  . SEXUALLY TRANSMITTED DISEASE    HPI  Patient reports that she was seen at the ER on Labor Day.  States that she was called and told that her test for [facility] was positive and to come here for treatment.  Reports that she is [redacted] weeks pregnant and has [place] set up in [facility].  Taking PNV gummies.  Declines screening today and only wants treatment.   See flowsheet for further details and programmatic requirements.    The following portions of the patient's history were reviewed and updated as appropriate: allergies, current medications, past medical history, past social history, past surgical history and problem list.  Objective:  There were no vitals filed for this visit.  Physical Exam Constitutional:      General: She is not in acute distress.    Appearance: Normal appearance.  HENT:     Head: Normocephalic and atraumatic.  Pulmonary:     Effort: Pulmonary effort is normal.  Neurological:     Mental Status: She is alert and oriented to person, place, and time.  Psychiatric:        Mood and Affect: Mood normal.        Behavior: Behavior normal.        Thought Content: Thought content normal.        Judgment: Judgment normal.       Assessment and Plan:  Veronica Ochoa is a 28 y.o. female presenting to the [facility] Department for STI screening  1. Screening for STD (sexually transmitted disease) Patient seen in the ER for symptoms, told her test for [facility] was  positive and sent here to get treatment. Rec condoms with all sex.  2. Gonococcal vulvovaginitis Treat for GC with Azithromycin 1 g po DOT and Ceftriaxone 250mg  IM today. No sex for 7 days and until after partner has completed treatment. Counseled patient to let her [facility] provider know when she was treated so they can determine when she should be retested to make sure infection has cleared. - azithromycin (ZITHROMAX) tablet 1,000 mg - cefTRIAXone (ROCEPHIN) injection 250 mg     No follow-ups on file.  No future appointments.  Jerene Dilling, PA

## 2019-05-13 ENCOUNTER — Emergency Department
Admission: EM | Admit: 2019-05-13 | Discharge: 2019-05-13 | Disposition: A | Discharge status: Home / Self Care | Payer: Medicaid Other | Attending: Emergency Medicine | Admitting: Emergency Medicine

## 2019-05-13 ENCOUNTER — Encounter: Payer: Self-pay | Admitting: Emergency Medicine

## 2019-05-13 ENCOUNTER — Other Ambulatory Visit: Payer: Self-pay

## 2019-05-13 DIAGNOSIS — Z3A08 8 weeks gestation of pregnancy: Secondary | ICD-10-CM | POA: Diagnosis not present

## 2019-05-13 DIAGNOSIS — O4691 Antepartum hemorrhage, unspecified, first trimester: Secondary | ICD-10-CM | POA: Diagnosis present

## 2019-05-13 DIAGNOSIS — Z91040 Latex allergy status: Secondary | ICD-10-CM | POA: Diagnosis not present

## 2019-05-13 DIAGNOSIS — N760 Acute vaginitis: Secondary | ICD-10-CM | POA: Diagnosis not present

## 2019-05-13 DIAGNOSIS — Z79899 Other long term (current) drug therapy: Secondary | ICD-10-CM | POA: Insufficient documentation

## 2019-05-13 DIAGNOSIS — O4690 Antepartum hemorrhage, unspecified, unspecified trimester: Secondary | ICD-10-CM

## 2019-05-13 LAB — ABO/RH: ABO/RH(D): O POS

## 2019-05-13 LAB — URINALYSIS, ROUTINE W REFLEX MICROSCOPIC
Bacteria, UA: NONE SEEN
Bilirubin Urine: NEGATIVE
Glucose, UA: NEGATIVE mg/dL
Ketones, ur: NEGATIVE mg/dL
Nitrite: NEGATIVE
Protein, ur: 100 mg/dL — AB
RBC / HPF: >50 RBC/hpf — ABNORMAL HIGH (ref 0–5)
Specific Gravity, Urine: 1.027 (ref 1.005–1.030)
WBC, UA: >50 WBC/hpf — ABNORMAL HIGH (ref 0–5)
pH: 8 (ref 5.0–8.0)

## 2019-05-13 LAB — HCG, QUANTITATIVE, PREGNANCY: hCG, Beta Chain, Quant, S: 142352 m[IU]/mL — ABNORMAL HIGH (ref <5)

## 2019-05-13 NOTE — Discharge Instructions (Signed)
Your blood type is O+.  Please follow up with your obstetrics clinic as scheduled. Your symptoms can be expected to resolve in the next few days.

## 2019-05-13 NOTE — ED Triage Notes (Signed)
Pt presents with vaginal bleeding today. She states she used some cream for yeast infection and when she wiped she saw blood on the toilet paper. She reports that it was "more than a streak but not as much as a period." Pt denies cramping. Pt alert & oriented, nad noted.

## 2019-05-13 NOTE — ED Provider Notes (Signed)
[facility] Emergency Department Provider Note  ____________________________________________  Time seen: Approximately 5:46 PM  I have reviewed the triage vital signs and the nursing notes.   HISTORY  Chief Complaint Vaginal Bleeding    HPI Veronica Ochoa is a 28 y.o. female with a history of depression, obesity, currently [redacted] weeks pregnant reports having a small amount of vaginal bleeding today described as streaking on toilet paper while wiping.  She was recently treated for yeast vaginitis, as well as given azithromycin and ceftriaxone yesterday after being diagnosed with gonorrhea.  She also recently had a pelvic ultrasound done which confirmed a live IUP at that time.  She is planning to follow-up with [facility] for her ongoing prenatal care.  She is taking prenatal vitamins.  Denies fevers chills body aches chest pain shortness of breath abdominal pain or vomiting.  She is eating and drinking normally.  No dysuria frequency urgency.      History reviewed. No pertinent past medical history.   Patient Active Problem List   Diagnosis Date Noted  . 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 Eczema 05/27/2015  . Obesity, unspecified 05/27/2015     History reviewed. No pertinent surgical history.   Prior to Admission medications   Medication Sig Start Date End Date Taking? Authorizing Provider  cephALEXin (KEFLEX) 500 MG capsule Take 500 mg by mouth 2 (two) times daily.    [provider]  clotrimazole (CLOTRIMAZOLE 3) 2 % vaginal cream Place 1 Applicatorful vaginally at bedtime. 04/30/19   Laban Emperor, PA-C  famotidine (PEPCID) 40 MG tablet Take 1 tablet (40 mg total) by mouth every evening. 10/17/16 10/17/17  Loney Hering, MD  Prenatal Vit-Fe Fumarate-FA (PRENATAL VITAMINS) 28-0.8 MG TABS Take 1 Dose by mouth daily. Patient not taking: Reported on  05/07/2019 04/30/19   Laban Emperor, PA-C  sucralfate (CARAFATE) 1 g tablet Take 1 tablet (1 g total) by mouth 2 (two) times daily. Patient not taking: Reported on 05/07/2019 10/17/16   Loney Hering, MD  sulfamethoxazole-trimethoprim (BACTRIM DS,SEPTRA DS) 800-160 MG tablet Take 1 tablet by mouth 2 (two) times daily. Patient not taking: Reported on 05/07/2019 06/07/15   Sable Feil, PA-C  triamcinolone cream (KENALOG) 0.1 % Apply 1 application topically 2 (two) times daily. Patient not taking: Reported on 05/07/2019 03/31/16   Johnn Hai, PA-C     Allergies Vancomycin, Latex, and Morphine and related   Family History  Problem Relation Age of Onset  . Diabetes Father   . Arthritis Maternal Grandmother   . Diabetes Maternal Grandmother   . Asthma Maternal Grandmother     Social History Social History   Tobacco Use  . Smoking status: Never Smoker  . Smokeless tobacco: Never Used  Substance Use Topics  . Alcohol use: Not Currently    Comment: occasional  . Drug use: No    Review of Systems  Constitutional:   No fever or chills.  ENT:   No sore throat. No rhinorrhea. Cardiovascular:   No chest pain or syncope. Respiratory:   No dyspnea or cough. Gastrointestinal:   Negative for abdominal pain, vomiting and diarrhea.  Musculoskeletal:   Negative for focal pain or swelling All other systems reviewed and are negative except as documented above in ROS and HPI.  ____________________________________________   PHYSICAL EXAM:  VITAL SIGNS: ED Triage Vitals  Enc Vitals Group     BP 05/13/19  1524 123/60     Pulse Rate 05/13/19 1524 91     Resp 05/13/19 1524 18     Temp 05/13/19 1524 98.7 F (37.1 C)     Temp Source 05/13/19 1524 Oral     SpO2 05/13/19 1524 100 %     Weight 05/13/19 1525 240 lb (108.9 kg)     Height 05/13/19 1525 5\' 7"  (1.702 m)     Head Circumference --      Peak Flow --      Pain Score 05/13/19 1524 8     Pain Loc --      Pain Edu? --       Excl. in GC? --     Vital signs reviewed, nursing assessments reviewed.   Constitutional:   Alert and oriented. Non-toxic appearance. Eyes:   Conjunctivae are normal. EOMI. PERRL. ENT      Head:   Normocephalic and atraumatic.        Neck:   No meningismus. Full ROM. Hematological/Lymphatic/Immunilogical:   No cervical lymphadenopathy. Cardiovascular:   RRR. Symmetric bilateral radial and DP pulses.   Respiratory:   Normal respiratory effort without tachypnea/retractions. Breath sounds are clear and equal bilaterally. No wheezes/rales/rhonchi. Gastrointestinal:   Soft and nontender. Non distended. There is no CVA tenderness.  No rebound, rigidity, or guarding. Musculoskeletal:   Normal range of motion in all extremities. No joint effusions.  No lower extremity tenderness.  No edema. Neurologic:   Normal speech and language.  Motor grossly intact. No acute focal neurologic deficits are appreciated.  Skin:    Skin is warm, dry and intact. No rash noted.  No petechiae, purpura, or bullae.  ____________________________________________    LABS (pertinent positives/negatives) (all labs ordered are listed, but only abnormal results are displayed) Labs Reviewed  HCG, QUANTITATIVE, PREGNANCY - Abnormal; Notable for the following components:      Result Value   hCG, Beta Chain, Quant, S 142,352 (*)    All other components within normal limits  URINALYSIS, ROUTINE W REFLEX MICROSCOPIC - Abnormal; Notable for the following components:   Color, Urine AMBER (*)    APPearance TURBID (*)    Hgb urine dipstick SMALL (*)    Protein, ur 100 (*)    Leukocytes,Ua LARGE (*)    RBC / HPF >50 (*)    WBC, UA >50 (*)    All other components within normal limits  URINE CULTURE  POC URINE PREG, ED  ABO/RH   ____________________________________________   EKG    ____________________________________________    RADIOLOGY  No results  found.  ____________________________________________   PROCEDURES Procedures  ____________________________________________    CLINICAL IMPRESSION / ASSESSMENT AND PLAN / ED COURSE  Medications ordered in the ED: Medications - No data to display  Pertinent labs & imaging results that were available during my care of the patient were reviewed by me and considered in my medical decision making (see chart for details).  Veronica Ochoa was evaluated in Emergency Department on 05/13/2019 for the symptoms described in the history of present illness. She was evaluated in the context of the global COVID-19 pandemic, which necessitated consideration that the patient might be at risk for infection with the SARS-CoV-2 virus that causes COVID-19. Institutional protocols and algorithms that pertain to the evaluation of patients at risk for COVID-19 are in a state of rapid change based on information released by regulatory bodies including the CDC and federal and state organizations. These policies and algorithms were followed during  the patient's care in the ED.   Patient presents with very light vaginal bleeding that started today.  Given recent work-up, she has a confirmed IUP, and STI testing has already been completed and treated.  No possibility of ectopic, not consistent with TOA or PID.  Today we need to check her Rh status to determine if RhoGam is needed after which she can be discharged home to follow-up with OB.  Continue prenatal vitamins.  I think that her bleeding is due to vaginitis related to her recent infections which can be expected to resolve as the infections are eradicated and her body heals.   ----------------------------------------- 6:05 PM on 05/13/2019 -----------------------------------------  Rh+, stable for discharge     ____________________________________________   FINAL CLINICAL IMPRESSION(S) / ED DIAGNOSES    Final diagnoses:  Vaginal bleeding in pregnancy   Acute vaginitis     ED Discharge Orders    None      Portions of this note were generated with dragon dictation software. Dictation errors may occur despite best attempts at proofreading.   Sharman CheekStafford, Demico Ploch, MD 05/13/19 [phone]

## 2019-05-16 LAB — URINE CULTURE: Culture: >=100000 — AB

## 2019-05-21 ENCOUNTER — Other Ambulatory Visit: Payer: Self-pay

## 2019-05-21 ENCOUNTER — Ambulatory Visit (LOCAL_COMMUNITY_HEALTH_CENTER): Payer: Medicaid Other

## 2019-05-21 DIAGNOSIS — A5402 Gonococcal vulvovaginitis, unspecified: Secondary | ICD-10-CM

## 2019-05-21 DIAGNOSIS — Z113 Encounter for screening for infections with a predominantly sexual mode of transmission: Secondary | ICD-10-CM

## 2019-05-21 DIAGNOSIS — A5400 Gonococcal infection of lower genitourinary tract, unspecified: Secondary | ICD-10-CM

## 2019-05-21 MED ORDER — AZITHROMYCIN 500 MG PO TABS
1000.0000 mg | ORAL_TABLET | Freq: Once | ORAL | Status: AC
  Administered 2019-05-21: 11:00:00 1000 mg via ORAL

## 2019-05-21 MED ORDER — CEFTRIAXONE SODIUM 250 MG IJ SOLR
250.0000 mg | Freq: Once | INTRAMUSCULAR | Status: AC
  Administered 2019-05-21: 250 mg via INTRAMUSCULAR

## 2019-05-21 NOTE — Progress Notes (Signed)
Patient reports vomited after last treatment for [name]; needs re-treatment. Veronica Fass, RN

## 2019-08-16 ENCOUNTER — Encounter: Payer: Self-pay | Admitting: Obstetrics and Gynecology

## 2019-08-16 ENCOUNTER — Inpatient Hospital Stay
Admission: EM | Admit: 2019-08-16 | Discharge: 2019-08-17 | DRG: 805 | Disposition: A | Discharge status: Home / Self Care | Payer: Medicaid Other | Attending: Obstetrics and Gynecology | Admitting: Obstetrics and Gynecology

## 2019-08-16 ENCOUNTER — Other Ambulatory Visit: Payer: Self-pay

## 2019-08-16 DIAGNOSIS — Z3A22 22 weeks gestation of pregnancy: Secondary | ICD-10-CM | POA: Diagnosis not present

## 2019-08-16 DIAGNOSIS — O42912 Preterm premature rupture of membranes, unspecified as to length of time between rupture and onset of labor, second trimester: Principal | ICD-10-CM | POA: Diagnosis present

## 2019-08-16 DIAGNOSIS — O99344 Other mental disorders complicating childbirth: Secondary | ICD-10-CM | POA: Diagnosis present

## 2019-08-16 DIAGNOSIS — O42012 Preterm premature rupture of membranes, onset of labor within 24 hours of rupture, second trimester: Secondary | ICD-10-CM | POA: Diagnosis not present

## 2019-08-16 DIAGNOSIS — Z20828 Contact with and (suspected) exposure to other viral communicable diseases: Secondary | ICD-10-CM | POA: Diagnosis present

## 2019-08-16 DIAGNOSIS — F4321 Adjustment disorder with depressed mood: Secondary | ICD-10-CM | POA: Diagnosis present

## 2019-08-16 DIAGNOSIS — Z8659 Personal history of other mental and behavioral disorders: Secondary | ICD-10-CM

## 2019-08-16 HISTORY — DX: Preterm premature rupture of membranes, onset of labor within 24 hours of rupture, second trimester: O42.012

## 2019-08-16 LAB — WET PREP, GENITAL
Clue Cells Wet Prep HPF POC: NONE SEEN
Sperm: NONE SEEN
Trich, Wet Prep: NONE SEEN
Yeast Wet Prep HPF POC: NONE SEEN

## 2019-08-16 LAB — RESPIRATORY PANEL BY RT PCR (FLU A&B, COVID)
Influenza A by PCR: NEGATIVE
Influenza B by PCR: NEGATIVE
SARS Coronavirus 2 by RT PCR: NEGATIVE

## 2019-08-16 LAB — CBC
HCT: 30.4 % — ABNORMAL LOW (ref 36.0–46.0)
Hemoglobin: 10.6 g/dL — ABNORMAL LOW (ref 12.0–15.0)
MCH: 28.1 pg (ref 26.0–34.0)
MCHC: 34.9 g/dL (ref 30.0–36.0)
MCV: 80.6 fL (ref 80.0–100.0)
Platelets: 272 10*3/uL (ref 150–400)
RBC: 3.77 MIL/uL — ABNORMAL LOW (ref 3.87–5.11)
RDW: 13.6 % (ref 11.5–15.5)
WBC: 18.7 10*3/uL — ABNORMAL HIGH (ref 4.0–10.5)
nRBC: 0 % (ref 0.0–0.2)

## 2019-08-16 LAB — RAPID HIV SCREEN (HIV 1/2 AB+AG)
HIV 1/2 Antibodies: NONREACTIVE
HIV-1 P24 Antigen - HIV24: NONREACTIVE

## 2019-08-16 LAB — TYPE AND SCREEN
ABO/RH(D): O POS
Antibody Screen: NEGATIVE

## 2019-08-16 LAB — CHLAMYDIA/NGC RT PCR (ARMC ONLY)
Chlamydia Tr: NOT DETECTED
N gonorrhoeae: NOT DETECTED

## 2019-08-16 LAB — RUPTURE OF MEMBRANE (ROM)PLUS: Rom Plus: POSITIVE

## 2019-08-16 MED ORDER — OXYTOCIN 40 UNITS IN NORMAL SALINE INFUSION - SIMPLE MED
2.5000 [IU]/h | INTRAVENOUS | Status: DC
  Filled 2019-08-16: qty 1000

## 2019-08-16 MED ORDER — HYDROMORPHONE 1 MG/ML IV SOLN: INTRAVENOUS | Status: DC

## 2019-08-16 MED ORDER — TETANUS-DIPHTH-ACELL PERTUSSIS 5-2.5-18.5 LF-MCG/0.5 IM SUSP
0.5000 mL | Freq: Once | INTRAMUSCULAR | Status: DC
  Filled 2019-08-16: qty 0.5

## 2019-08-16 MED ORDER — ACETAMINOPHEN 325 MG PO TABS: 650.0000 mg | ORAL_TABLET | ORAL | Status: DC | PRN

## 2019-08-16 MED ORDER — NALOXONE HCL 0.4 MG/ML IJ SOLN: 0.4000 mg | INTRAMUSCULAR | Status: DC | PRN

## 2019-08-16 MED ORDER — COCONUT OIL OIL: 1.0000 "application " | TOPICAL_OIL | Status: DC | PRN

## 2019-08-16 MED ORDER — SENNOSIDES-DOCUSATE SODIUM 8.6-50 MG PO TABS: 2.0000 | ORAL_TABLET | ORAL | Status: DC

## 2019-08-16 MED ORDER — ONDANSETRON HCL 4 MG/2ML IJ SOLN: 4.0000 mg | Freq: Four times a day (QID) | INTRAMUSCULAR | Status: DC | PRN

## 2019-08-16 MED ORDER — BUTORPHANOL TARTRATE 1 MG/ML IJ SOLN: 1.0000 mg | Freq: Once | INTRAMUSCULAR | Status: AC

## 2019-08-16 MED ORDER — BUTORPHANOL TARTRATE 1 MG/ML IJ SOLN
INTRAMUSCULAR | Status: AC
  Administered 2019-08-16: 1 mg via INTRAMUSCULAR
  Filled 2019-08-16: qty 1

## 2019-08-16 MED ORDER — DIBUCAINE (PERIANAL) 1 % EX OINT: 1.0000 "application " | TOPICAL_OINTMENT | CUTANEOUS | Status: DC | PRN

## 2019-08-16 MED ORDER — BUTORPHANOL TARTRATE 1 MG/ML IJ SOLN: 2.0000 mg | Freq: Once | INTRAMUSCULAR | Status: AC

## 2019-08-16 MED ORDER — HYDROMORPHONE 1 MG/ML IV SOLN
INTRAVENOUS | Status: DC
  Filled 2019-08-16 (×2): qty 30

## 2019-08-16 MED ORDER — ONDANSETRON HCL 4 MG/2ML IJ SOLN: 4.0000 mg | INTRAMUSCULAR | Status: DC | PRN

## 2019-08-16 MED ORDER — LIDOCAINE HCL (PF) 1 % IJ SOLN: 30.0000 mL | INTRAMUSCULAR | Status: DC | PRN

## 2019-08-16 MED ORDER — BETAMETHASONE SOD PHOS & ACET 6 (3-3) MG/ML IJ SUSP
INTRAMUSCULAR | Status: AC
  Filled 2019-08-16: qty 5

## 2019-08-16 MED ORDER — BETAMETHASONE SOD PHOS & ACET 6 (3-3) MG/ML IJ SUSP
12.0000 mg | Freq: Once | INTRAMUSCULAR | Status: AC
  Administered 2019-08-16: 12 mg via INTRAMUSCULAR

## 2019-08-16 MED ORDER — ZOLPIDEM TARTRATE 5 MG PO TABS
5.0000 mg | ORAL_TABLET | Freq: Every evening | ORAL | Status: DC | PRN
  Administered 2019-08-17: 5 mg via ORAL
  Filled 2019-08-16: qty 1

## 2019-08-16 MED ORDER — ACETAMINOPHEN 500 MG PO TABS
1000.0000 mg | ORAL_TABLET | Freq: Once | ORAL | Status: AC
  Administered 2019-08-16: 1000 mg via ORAL

## 2019-08-16 MED ORDER — BUTORPHANOL TARTRATE 1 MG/ML IJ SOLN
INTRAMUSCULAR | Status: AC
  Administered 2019-08-16: 2 mg via INTRAVENOUS
  Filled 2019-08-16: qty 2

## 2019-08-16 MED ORDER — OXYCODONE-ACETAMINOPHEN 5-325 MG PO TABS: 2.0000 | ORAL_TABLET | ORAL | Status: DC | PRN

## 2019-08-16 MED ORDER — IBUPROFEN 800 MG PO TABS
800.0000 mg | ORAL_TABLET | Freq: Four times a day (QID) | ORAL | Status: DC
  Administered 2019-08-16 – 2019-08-17 (×2): 800 mg via ORAL
  Filled 2019-08-16 (×2): qty 1

## 2019-08-16 MED ORDER — BENZOCAINE-MENTHOL 20-0.5 % EX AERO: 1.0000 "application " | INHALATION_SPRAY | CUTANEOUS | Status: DC | PRN

## 2019-08-16 MED ORDER — LACTATED RINGERS IV SOLN: 500.0000 mL | INTRAVENOUS | Status: DC | PRN

## 2019-08-16 MED ORDER — ONDANSETRON HCL 4 MG PO TABS: 4.0000 mg | ORAL_TABLET | ORAL | Status: DC | PRN

## 2019-08-16 MED ORDER — DIPHENHYDRAMINE HCL 50 MG/ML IJ SOLN: 12.5000 mg | Freq: Four times a day (QID) | INTRAMUSCULAR | Status: DC | PRN

## 2019-08-16 MED ORDER — LACTATED RINGERS IV SOLN: INTRAVENOUS | Status: DC

## 2019-08-16 MED ORDER — WITCH HAZEL-GLYCERIN EX PADS: 1.0000 "application " | MEDICATED_PAD | CUTANEOUS | Status: DC | PRN

## 2019-08-16 MED ORDER — ACETAMINOPHEN 500 MG PO TABS
ORAL_TABLET | ORAL | Status: AC
  Filled 2019-08-16: qty 2

## 2019-08-16 MED ORDER — DIPHENHYDRAMINE HCL 25 MG PO CAPS: 25.0000 mg | ORAL_CAPSULE | Freq: Four times a day (QID) | ORAL | Status: DC | PRN

## 2019-08-16 MED ORDER — DIPHENHYDRAMINE HCL 12.5 MG/5ML PO ELIX
12.5000 mg | ORAL_SOLUTION | Freq: Four times a day (QID) | ORAL | Status: DC | PRN
  Filled 2019-08-16: qty 5

## 2019-08-16 MED ORDER — OXYCODONE-ACETAMINOPHEN 5-325 MG PO TABS
1.0000 | ORAL_TABLET | ORAL | Status: DC | PRN
  Administered 2019-08-16: 1 via ORAL
  Filled 2019-08-16: qty 1

## 2019-08-16 MED ORDER — SIMETHICONE 80 MG PO CHEW: 80.0000 mg | CHEWABLE_TABLET | ORAL | Status: DC | PRN

## 2019-08-16 MED ORDER — SODIUM CHLORIDE 0.9% FLUSH: 9.0000 mL | INTRAVENOUS | Status: DC | PRN

## 2019-08-16 MED ORDER — OXYTOCIN BOLUS FROM INFUSION
500.0000 mL | Freq: Once | INTRAVENOUS | Status: AC
  Administered 2019-08-16: 500 mL via INTRAVENOUS

## 2019-08-16 NOTE — OB Triage Note (Addendum)
Patient to obs 4 for cramping and bleeding.  She reports that bleeding began 3 days ago, but increased today.  Cramping has been going on for 3 days.  She is normally seen at [facility] Unable to find fhr with doppler, initially.

## 2019-08-16 NOTE — H&P (Signed)
Obstetric History and Physical  Veronica Ochoa is a 28 y.o. unassigned G1P0000 with IUP at [redacted]w[redacted]d presenting for complaints of cramping and bleeding.  She notes that she has been having bleeding for the past 3 days, was light, but increased today.  Also passing small clots.  She also notes that she has been possibly been leaking fluid for the past week. Notes that she contacted her OB providers at [facility] earlier during the week, and they told her that she might be miscarrying, "but not to worry about it". She has been noting fetal movement.  Last prenatal appointment was ~ 3 weeks ago, notes having her anatomy scan at that time and everything was normal.    Prenatal Course Source of Care: [facility]  Pregnancy complications or risks: Patient Active Problem List   Diagnosis Date Noted  . Preterm premature rupture of membranes (PPROM) with onset of labor within 24 hours of rupture in second trimester, antepartum 08/16/2019  . 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 Eczema 05/27/2015  . Obesity, unspecified 05/27/2015    Prenatal labs and studies: ABO, Rh: --/--/O POS Performed at [facility], [address] [place], [place] [address]  [phone]) Antibody:   Rubella:   RPR:    HBsAg:    HIV:     Genetic screening normal per patient Anatomy US normal per patient  No past medical history on file.  No past surgical history on file.  OB History  Gravida Para Term Preterm AB Living  1 0 0 0 0 0  SAB TAB Ectopic Multiple Live Births  0 0 0 0 0    # Outcome Date GA Lbr Len/2nd Weight Sex Delivery Anes PTL Lv  1 Current             Social History   Socioeconomic History  . Marital status: Single    Spouse name: Not on file  . Number of children: Not on file  . Years of education: Not on file  . Highest education level: Not on file  Occupational History  . Not  on file  Tobacco Use  . Smoking status: Never Smoker  . Smokeless tobacco: Never Used  Substance and Sexual Activity  . Alcohol use: Not Currently    Comment: occasional  . Drug use: No  . Sexual activity: Not on file  Other Topics Concern  . Not on file  Social History Narrative  . Not on file   Social Determinants of Health   Financial Resource Strain:   . Difficulty of Paying Living Expenses: Not on file  Food Insecurity:   . Worried About Charity fundraiser in the Last Year: Not on file  . Ran Out of Food in the Last Year: Not on file  Transportation Needs:   . Lack of Transportation (Medical): Not on file  . Lack of Transportation (Non-Medical): Not on file  Physical Activity:   . Days of Exercise per Week: Not on file  . Minutes of Exercise per Session: Not on file  Stress:   . Feeling of Stress : Not on file  Social Connections:   . Frequency of Communication with Friends and Family: Not on file  . Frequency of Social Gatherings with Friends and Family: Not on file  . Attends Religious Services: Not on file  . Active Member of Clubs or Organizations: Not on file  .  Attends Banker Meetings: Not on file  . Marital Status: Not on file    Family History  Problem Relation Age of Onset  . Diabetes Father   . Arthritis Maternal Grandmother   . Diabetes Maternal Grandmother   . Asthma Maternal Grandmother     Medications Prior to Admission  Medication Sig Dispense Refill Last Dose  . Prenatal Vit-Fe Fumarate-FA (PRENATAL VITAMINS) 28-0.8 MG TABS Take 1 Dose by mouth daily. 30 tablet 0 08/16/2019 at Unknown time  . cephALEXin (KEFLEX) 500 MG capsule Take 500 mg by mouth 2 (two) times daily.   Not Taking at Unknown time  . clotrimazole (CLOTRIMAZOLE 3) 2 % vaginal cream Place 1 Applicatorful vaginally at bedtime. (Patient not taking: Reported on 08/16/2019) 21 g 0 Not Taking at Unknown time  . famotidine (PEPCID) 40 MG tablet Take 1 tablet (40 mg total)  by mouth every evening. 15 tablet 0   . sucralfate (CARAFATE) 1 g tablet Take 1 tablet (1 g total) by mouth 2 (two) times daily. (Patient not taking: Reported on 05/07/2019) 20 tablet 0 Not Taking at Unknown time  . sulfamethoxazole-trimethoprim (BACTRIM DS,SEPTRA DS) 800-160 MG tablet Take 1 tablet by mouth 2 (two) times daily. (Patient not taking: Reported on 05/07/2019) 20 tablet 0 Not Taking at Unknown time  . triamcinolone cream (KENALOG) 0.1 % Apply 1 application topically 2 (two) times daily. (Patient not taking: Reported on 05/07/2019) 15 g 0 Not Taking at Unknown time    Allergies  Allergen Reactions  . Vancomycin Hives and Swelling  . Latex Swelling  . Morphine And Related Hives    Review of Systems: Negative except for what is mentioned in HPI.  Physical Exam: BP 128/80 (BP Location: Right Arm)   Pulse 89   Temp 98.1 F (36.7 C) (Oral)   Resp 18   Ht 5\' 7"  (1.702 m)   Wt 104.3 kg   LMP 03/12/2019 (Exact Date) Comment: normal  SpO2 97%   BMI 36.02 kg/m  CONSTITUTIONAL: Well-developed, well-nourished female in moderate distress.  HENT:  Normocephalic, atraumatic, External right and left ear normal. Oropharynx is clear and moist EYES: Conjunctivae and EOM are normal. Pupils are equal, round, and reactive to light. No scleral icterus.  NECK: Normal range of motion, supple, no masses SKIN: Skin is warm and dry. No rash noted. Not diaphoretic. No erythema. No pallor. NEUROLOGIC: Alert and oriented to person, place, and time. Normal reflexes, muscle tone coordination. No cranial nerve deficit noted. PSYCHIATRIC: Normal mood and affect. Normal behavior. Normal judgment and thought content. CARDIOVASCULAR: Normal heart rate noted, regular rhythm RESPIRATORY: Effort and breath sounds normal, no problems with respiration noted. ABDOMEN: Soft, nontender, nondistended, gravid. MUSCULOSKELETAL: Normal range of motion. No edema and no tenderness. 2+ distal pulses.  Cervical Exam:  Dilatation 5-6cm   Effacement 80%.  Bulging membranes, but no fetal parts palpable.  Small to moderate amount of blood noted on exam.  Perineum appeared moist.  Presentation: cephalic FHT: Fetal heart tones obtained, 148 bpm. Difficulty tracing fetus with external fetal monitor.  Contractions: Palpable ever 2-3 minutes.    Pertinent Labs/Studies:   Results for orders placed or performed during the hospital encounter of 08/16/19 (from the past 24 hour(s))  Wet prep, genital     Status: Abnormal   Collection Time: 08/16/19  3:13 PM  Result Value Ref Range   Yeast Wet Prep HPF POC NONE SEEN NONE SEEN   Trich, Wet Prep NONE SEEN NONE SEEN  Clue Cells Wet Prep HPF POC NONE SEEN NONE SEEN   WBC, Wet Prep HPF POC FEW (A) NONE SEEN   Sperm NONE SEEN   ROM Plus ([facility] only)     Status: None   Collection Time: 08/16/19  3:14 PM  Result Value Ref Range   Rom Plus POSITIVE     Imaging:  Bedside sono with SIUP present, footling breech presentation. FHT present (148), anterior placenta.  AFI relatively normal. EFW 366 grams.  Assessment : Veronica Ochoa is a 28 y.o. G1P0000 at 5923w3d being admitted for PPROM in second trimester with preterm labor  Plan: Labor: Expectant management. Analgesia as needed. Discussion had that fetus was previable, was not likely to survive, however can still attempt to administer antenatal steroids. Neonatology can consult. Analgesia as needed. Labs ordered. Will attempt to locate prenatal records  FWB: Fetal heart tones present at this time. Delivery plan: Plan for vaginal delivery    Hildred Laserherry, Riko Lumsden, MD [facility]

## 2019-08-17 ENCOUNTER — Encounter: Payer: Self-pay | Admitting: Obstetrics and Gynecology

## 2019-08-17 DIAGNOSIS — F4321 Adjustment disorder with depressed mood: Secondary | ICD-10-CM | POA: Diagnosis not present

## 2019-08-17 LAB — HEPATITIS B SURFACE ANTIGEN: Hepatitis B Surface Ag: NONREACTIVE

## 2019-08-17 MED ORDER — BUPROPION HCL ER (XL) 150 MG PO TB24: 150.0000 mg | ORAL_TABLET | Freq: Every day | ORAL | 6 refills | Status: DC

## 2019-08-17 MED ORDER — METRONIDAZOLE 500 MG PO TABS: 500.0000 mg | ORAL_TABLET | Freq: Three times a day (TID) | ORAL | 0 refills | Status: AC

## 2019-08-17 MED ORDER — FLUCONAZOLE 150 MG PO TABS: 150.0000 mg | ORAL_TABLET | Freq: Once | ORAL | 0 refills | Status: AC

## 2019-08-17 MED ORDER — METRONIDAZOLE 500 MG PO TABS
500.0000 mg | ORAL_TABLET | Freq: Two times a day (BID) | ORAL | Status: DC
  Filled 2019-08-17: qty 1

## 2019-08-17 MED ORDER — ACETAMINOPHEN 325 MG PO TABS: 650.0000 mg | ORAL_TABLET | ORAL | 1 refills | Status: DC | PRN

## 2019-08-17 MED ORDER — BUPROPION HCL ER (XL) 150 MG PO TB24
150.0000 mg | ORAL_TABLET | Freq: Every day | ORAL | Status: DC
  Filled 2019-08-17: qty 1

## 2019-08-17 MED ORDER — IBUPROFEN 800 MG PO TABS: 800.0000 mg | ORAL_TABLET | Freq: Three times a day (TID) | ORAL | 1 refills | Status: DC | PRN

## 2019-08-17 MED ORDER — ZOLPIDEM TARTRATE 10 MG PO TABS: 10.0000 mg | ORAL_TABLET | Freq: Every evening | ORAL | 0 refills | Status: DC | PRN

## 2019-08-17 NOTE — Discharge Summary (Signed)
OB Discharge Summary     Patient Name: Veronica Ochoa DOB: 06/15/1991 MRN: [id number]  Date of admission: 08/16/2019 Delivering MD: Hildred LaserHERRY, Delores Thelen   Date of discharge: 08/17/2019  Admitting diagnosis: Preterm premature rupture of membranes (PPROM) with onset of labor within 24 hours of rupture in second trimester, antepartum [O42.012] Intrauterine pregnancy: 3462w3d     Secondary diagnosis:  Active Problems:   Preterm premature rupture of membranes (PPROM) with onset of labor within 24 hours of rupture in second trimester, antepartum  Additional problems: History of depression with suicidal ideation     Discharge diagnosis: Preterm Pregnancy Delivered                                                                                                Post partum procedures:None.  Consultation with Psychiatry.  Augmentation: None  Complications: None  Hospital course:  Onset of Labor With Vaginal Delivery     28 y.o. yo G1P0100 at 2062w3d was admitted in Active Labor on 08/16/2019. Patient had an uncomplicated labor course as follows:  Membrane Rupture Time/Date: 5:25 PM ,08/16/2019   Intrapartum Procedures: Episiotomy: None [1]                                         Lacerations:  None [1]  Patient had a delivery of a Non Viable female infant. 08/16/2019  Information for the patient's newborn:  Alvira PhilipsJohnson, PendingBaby FD [[id number]][[id number]]  Delivery Method: Vaginal, Spontaneous(Filed from Delivery Summary)     Pateint had an uncomplicated postpartum course.  She is ambulating, tolerating a regular diet, passing flatus, and urinating well. Patient is discharged home in stable condition on 08/17/19.   Physical exam  Vitals:   08/17/19 0410 08/17/19 0411 08/17/19 0749 08/17/19 0751  BP: 101/83 (!) 109/52  (!) 115/56  Pulse: 92 75  85  Resp: 16 17  14   Temp: 97.9 F (36.6 C) 97.8 F (36.6 C) 98.3 F (36.8 C)   TempSrc: Oral Oral Oral   SpO2:      Weight:      Height:       General:  alert, cooperative and no distress Lochia: appropriate Uterine Fundus: firm Incision: N/A DVT Evaluation: No evidence of DVT seen on physical exam. Negative Homan's sign. No cords or calf tenderness. No significant calf/ankle edema.   Labs: Results for orders placed or performed during the hospital encounter of 08/16/19  Wet prep, genital  Result Value Ref Range   Yeast Wet Prep HPF POC NONE SEEN NONE SEEN   Trich, Wet Prep NONE SEEN NONE SEEN   Clue Cells Wet Prep HPF POC NONE SEEN NONE SEEN   WBC, Wet Prep HPF POC FEW (A) NONE SEEN   Sperm NONE SEEN   Chlamydia/NGC rt PCR ([facility] only)   Specimen: Genital  Result Value Ref Range   Specimen source GC/Chlam ENDOCERVICAL    Chlamydia Tr NOT DETECTED NOT DETECTED   N gonorrhoeae NOT DETECTED NOT DETECTED  Aerobic Culture (superficial specimen)  Specimen: PLACENTA.; Tissue  Result Value Ref Range   Specimen Description      PLACENTA Performed at [facility], [address], [place], [place] [address]    Special Requests      NONE Performed at [facility], [address] [place], [place] [address]    Gram Stain      NO WBC SEEN NO ORGANISMS SEEN Performed at [facility], [address], [place], [place] [address]    Culture PENDING    Report Status PENDING   Respiratory Panel by RT PCR (Flu A&B, Covid) - Nasopharyngeal Swab   Specimen: Nasopharyngeal Swab  Result Value Ref Range   SARS Coronavirus 2 by RT PCR NEGATIVE NEGATIVE   Influenza A by PCR NEGATIVE NEGATIVE   Influenza B by PCR NEGATIVE NEGATIVE  ROM Plus ([facility] only)  Result Value Ref Range   Rom Plus POSITIVE   CBC  Result Value Ref Range   WBC 18.7 (H) 4.0 - 10.5 K/uL   RBC 3.77 (L) 3.87 - 5.11 MIL/uL   Hemoglobin 10.6 (L) 12.0 - 15.0 g/dL   HCT 97.9 (L) 89.2 - 11.9 %   MCV 80.6 80.0 - 100.0 fL   MCH 28.1 26.0 - 34.0 pg   MCHC 34.9 30.0 - 36.0 g/dL   RDW 41.7 40.8 - 14.4 %   Platelets 272 150 - 400 K/uL   nRBC 0.0 0.0  - 0.2 %  Rapid HIV screen (HIV 1/2 Ab+Ag) ([facility] Only)  Result Value Ref Range   HIV-1 P24 Antigen - HIV24 NON REACTIVE NON REACTIVE   HIV 1/2 Antibodies NON REACTIVE NON REACTIVE   Interpretation (HIV Ag Ab)      A non reactive test result means that HIV 1 or HIV 2 antibodies and HIV 1 p24 antigen were not detected in the specimen.  Hepatitis B surface antigen  Result Value Ref Range   Hepatitis B Surface Ag NON REACTIVE NON REACTIVE  Type and screen [facility]  Result Value Ref Range   ABO/RH(D) O POS    Antibody Screen NEG    Sample Expiration      08/19/2019,2359 Performed at [facility], [address], [place], [place] [address]     Discharge instruction: per After Visit Summary  After visit meds:  Allergies as of 08/17/2019      Reactions   Vancomycin Hives, Swelling   Latex Swelling   Morphine And Related Hives      Medication List    STOP taking these medications   cephALEXin 500 MG capsule Commonly known as: KEFLEX   Clotrimazole 3 2 % vaginal cream Generic drug: clotrimazole   famotidine 40 MG tablet Commonly known as: PEPCID   sucralfate 1 g tablet Commonly known as: Carafate   sulfamethoxazole-trimethoprim 800-160 MG tablet Commonly known as: BACTRIM DS   triamcinolone cream 0.1 % Commonly known as: KENALOG     TAKE these medications   acetaminophen 325 MG tablet Commonly known as: Tylenol Take 2 tablets (650 mg total) by mouth every 4 (four) hours as needed (for pain scale < 4).   fluconazole 150 MG tablet Commonly known as: DIFLUCAN Take 1 tablet (150 mg total) by mouth once for 1 dose. Take after full course of Flagyl.   ibuprofen 800 MG tablet Commonly known as: ADVIL Take 1 tablet (800 mg total) by mouth every 8 (eight) hours as needed.   metroNIDAZOLE 500 MG tablet Commonly known as: Flagyl  Take 1 tablet (500 mg total) by mouth 3 (three) times daily for 14 days.   Prenatal Vitamins 28-0.8 MG  Tabs Take 1 Dose by mouth daily.   zolpidem 10 MG tablet Commonly known as: Ambien Take 1 tablet (10 mg total) by mouth at bedtime as needed for sleep.       Diet: routine diet  Activity: Advance as tolerated. Pelvic rest for 2-3 weeks.   Outpatient follow up:2-5 days with prenatal provider Follow up Appt:No future appointments. Follow up Visit:No follow-ups on file.  Postpartum contraception: Not Discussed  Newborn Data: Live born female  Birth Weight: 11 oz (312 g) APGAR: 0, 0  Newborn Delivery   Birth date/time: 08/16/2019 17:25:00 Delivery type: Vaginal, Spontaneous      Disposition:morgue   08/17/2019 Rubie Maid, MD

## 2019-08-17 NOTE — Progress Notes (Signed)
Post Partum Day # 1, s/p SVD  Subjective: no complaints, up ad lib, voiding and tolerating PO  Objective: Temp:  [97.3 F (36.3 C)-98.4 F (36.9 C)] 98.3 F (36.8 C) (12/20 0749) Pulse Rate:  [75-96] 85 (12/20 0751) Resp:  [14-20] 14 (12/20 0751) BP: (101-128)/(51-86) 115/56 (12/20 0751) SpO2:  [97 %] 97 % (12/19 1347) Weight:  [104.3 kg] 104.3 kg (12/19 1448)  Physical Exam:  General: alert and no distress  Lungs: clear to auscultation bilaterally Breasts: normal appearance, no masses or tenderness Heart: regular rate and rhythm, S1, S2 normal, no murmur, click, rub or gallop Abdomen: soft, non-tender; bowel sounds normal; no masses,  no organomegaly Pelvis: Lochia: appropriate, Uterine Fundus: firm Extremities: DVT Evaluation: No evidence of DVT seen on physical exam. Negative Homan's sign. No cords or calf tenderness. No significant calf/ankle edema.   Recent Labs    08/16/19 1804  HGB 10.6*  HCT 30.4*   Results for orders placed or performed during the hospital encounter of 08/16/19  Wet prep, genital  Result Value Ref Range   Yeast Wet Prep HPF POC NONE SEEN NONE SEEN   Trich, Wet Prep NONE SEEN NONE SEEN   Clue Cells Wet Prep HPF POC NONE SEEN NONE SEEN   WBC, Wet Prep HPF POC FEW (A) NONE SEEN   Sperm NONE SEEN   Chlamydia/NGC rt PCR ([facility] only)   Specimen: Genital  Result Value Ref Range   Specimen source GC/Chlam ENDOCERVICAL    Chlamydia Tr NOT DETECTED NOT DETECTED   N gonorrhoeae NOT DETECTED NOT DETECTED  Aerobic Culture (superficial specimen)   Specimen: PLACENTA.; Tissue  Respiratory Panel by RT PCR (Flu A&B, Covid) - Nasopharyngeal Swab   Specimen: Nasopharyngeal Swab  Result Value Ref Range   SARS Coronavirus 2 by RT PCR NEGATIVE NEGATIVE   Influenza A by PCR NEGATIVE NEGATIVE   Influenza B by PCR NEGATIVE NEGATIVE  Rapid HIV screen (HIV 1/2 Ab+Ag) ([facility] Only)  Result Value Ref Range   HIV-1 P24 Antigen - HIV24 NON REACTIVE NON REACTIVE   HIV 1/2 Antibodies NON REACTIVE NON REACTIVE   Interpretation (HIV Ag Ab)      A non reactive test result means that HIV 1 or HIV 2 antibodies and HIV 1 p24 antigen were not detected in the specimen.  Hepatitis B surface antigen  Result Value Ref Range   Hepatitis B Surface Ag NON REACTIVE NON REACTIVE  Type and screen [place]  Result Value Ref Range   ABO/RH(D) O POS    Antibody Screen NEG    Sample Expiration      08/19/2019,2359 Performed at [facility], [address], [place], [place] [address]     Assessment/Plan: Continue routine care.  Chaplain services offered again, declines, but would like to speak with a therapist prior to discharge.  H/o depression with suicidal ideation, EDPS score 15. Will have patient speak to Psychiatry/Social Work. Has had therapy in the past, has never been on medications.  Patient encouraged to f/u with prenatal care provider next week.  Patient notes that her labs resulted recently from her OB/GYN office noting a new BV and yeast infection.  Will send medications to pharmacy for treatment.  Can d/c home later today.      LOS: 1 day   Rubie Maid, MD [facility]

## 2019-08-17 NOTE — Discharge Instructions (Signed)
[facility] (Mental Health & [facility]stance Use Services) & [facility]  Mental health service in [place], [place] Address: [address], [place], [place] [address] Hours:  Closed ? Opens 8AM Mon Phone: [phone] 

## 2019-08-17 NOTE — Consult Note (Addendum)
[facility] Consult   Reason for Consult:  Depression Referring Physician:  Dr Valentino Saxonherry Patient Identification: Veronica Ochoa MRN:  [id number] Principal Diagnosis: Miscarriage Diagnosis:  Active Problems:   Adjustment disorder with depressed mood   Preterm premature rupture of membranes (PPROM) with onset of labor within 24 hours of rupture in second trimester, antepartum  Total Time spent with patient: 1 hour  Subjective:   Veronica Ochoa is a 28 y.o. female patient admitted with premature labor, miscarriage, consult for depression.  Patient seen and evaluated in person by this provider.  She was admitted for premature labor and miscarried her baby.  Consult placed for depression and therapy.  Her mother is at her bedside and supportive and she reports her significant other, father the baby, is supportive but dealing with his own grief.  She has had a prior history of depression approximately 3 years ago and received therapy with positive effects.  She is interested in returning to therapy and also discussed antidepressant medication.  Educated on the use, duration, side effects, and recommendation for my 9 months of continuous therapy.  Her mother chimed in with only wanting her to have therapy but the client's spoke up and says she would like to have an option.  Discussed several different medicines and she chose bupropion.  This provider explained to her how medications and therapy work best for depression.  Also let her know that a prescription would be provided and it could be her choice whether she decides to start medication or not.  Therapy options and discharge instructions and prescription written.  No suicidal/homicidal ideations, hallucinations, or substance abuse.  No safety concerns at this time.  Support system:  Mother, significant other (father of the baby), grandmother.  HPI on admission:   Veronica Ochoa is a 28 y.o. unassigned G1P0000 with IUP at [id number]  presenting for complaints of cramping and bleeding.  She notes that she has been having bleeding for the past 3 days, was light, but increased today.  Also passing small clots.  She also notes that she has been possibly been leaking fluid for the past week. Notes that [facility], and they told her that she might be miscarrying, "but not to worry about it". She has been noting fetal movement.  Last prenatal appointment was ~ 3 weeks ago, notes having her anatomy scan at that time and everything was normal.   Past Psychiatric History: depression  Risk to Self:  none Risk to Others:  none Prior Inpatient Therapy:  none Prior Outpatient Therapy:  therapy  Past Medical History: History reviewed. No pertinent past medical history. History reviewed. No pertinent surgical history. Family History:  Family History  Problem Relation Age of Onset  . Diabetes Father   . Arthritis Maternal Grandmother   . Diabetes Maternal Grandmother   . Asthma Maternal Grandmother    Family Psychiatric  History: none Social History:  Social History   Substance and Sexual Activity  Alcohol Use Not Currently   Comment: occasional     Social History   Substance and Sexual Activity  Drug Use No    Social History   Socioeconomic History  . Marital status: Single    Spouse name: Not on file  . Number of children: Not on file  . Years of education: Not on file  . Highest education level: Not on file  Occupational History  . Not on file  Tobacco Use  . Smoking status: Never Smoker  . Smokeless tobacco: Never Used  Substance and Sexual Activity  . Alcohol use: Not Currently    Comment: occasional  . Drug use: No  . Sexual activity: Not on file  Other Topics Concern  . Not on file  Social History Narrative  . Not on file   Social Determinants of Health   Financial Resource Strain:   . Difficulty of Paying Living Expenses: Not on file  Food  Insecurity:   . Worried About Programme researcher, broadcasting/film/video in the Last Year: Not on file  . Ran Out of Food in the Last Year: Not on file  Transportation Needs:   . Lack of Transportation (Medical): Not on file  . Lack of Transportation (Non-Medical): Not on file  Physical Activity:   . Days of Exercise per Week: Not on file  . Minutes of Exercise per Session: Not on file  Stress:   . Feeling of Stress : Not on file  Social Connections:   . Frequency of Communication with Friends and Family: Not on file  . Frequency of Social Gatherings with Friends and Family: Not on file  . Attends Religious Services: Not on file  . Active Member of Clubs or Organizations: Not on file  . Attends Banker Meetings: Not on file  . Marital Status: Not on file   Additional Social History:    Allergies:   Allergies  Allergen Reactions  . Vancomycin Hives and Swelling  . Latex Swelling  . Morphine And Related Hives    Labs:  Results for orders placed or performed during the hospital encounter of 08/16/19 (from the past 48 hour(s))  Wet prep, genital     Status: Abnormal   Collection Time: 08/16/19  3:13 PM  Result Value Ref Range   Yeast Wet Prep HPF POC NONE SEEN NONE SEEN   Trich, Wet Prep NONE SEEN NONE SEEN   Clue Cells Wet Prep HPF POC NONE SEEN NONE SEEN   WBC, Wet Prep HPF POC FEW (A) NONE SEEN   Sperm NONE SEEN     Comment: Performed at [facility], [address] [place], [place] [address]  ROM Plus [facility] only)     Status: None   Collection Time: 08/16/19  3:14 PM  Result Value Ref Range   Rom Plus POSITIVE     Comment: Performed at [facility], [address] [place], [place] [address]  Chlamydia/NGC rt PCR [facility] only)     Status: None   Collection Time: 08/16/19  3:14 PM   Specimen: Genital  Result Value Ref Range   Specimen source GC/Chlam ENDOCERVICAL    Chlamydia Tr NOT DETECTED NOT DETECTED   N gonorrhoeae NOT DETECTED NOT DETECTED    Comment:  (NOTE) This CT/NG assay has not been evaluated in patients with a history of  hysterectomy. Performed at [facility], [address] [place], [place] [address]   Aerobic Culture (superficial specimen)     Status: None (Preliminary result)   Collection Time: 08/16/19  3:14 PM   Specimen: PLACENTA.; Tissue  Result Value Ref Range   Specimen Description      PLACENTA Performed at [facility], [address], [place], [place] [address]    Special Requests      NONE Performed at [facility], [address] [place], [place] [address]    Gram Stain NO WBC SEEN NO ORGANISMS SEEN  Culture      RARE HAEMOPHILUS PARAINFLUENZAE BETA LACTAMASE NEGATIVE Performed at [facility], [address], [place], [place] [address]    Report Status PENDING   CBC     Status: Abnormal   Collection Time: 08/16/19  6:04 PM  Result Value Ref Range   WBC 18.7 (H) 4.0 - 10.5 K/uL   RBC 3.77 (L) 3.87 - 5.11 MIL/uL   Hemoglobin 10.6 (L) 12.0 - 15.0 g/dL   HCT 66.0 (L) 63.0 - 16.0 %   MCV 80.6 80.0 - 100.0 fL   MCH 28.1 26.0 - 34.0 pg   MCHC 34.9 30.0 - 36.0 g/dL   RDW 10.9 32.3 - 55.7 %   Platelets 272 150 - 400 K/uL   nRBC 0.0 0.0 - 0.2 %    Comment: Performed at [facility], [address] [place], [place] [address]  Rapid HIV screen (HIV 1/2 Ab+Ag) ([facility] Only)     Status: None   Collection Time: 08/16/19  6:04 PM  Result Value Ref Range   HIV-1 P24 Antigen - HIV24 NON REACTIVE NON REACTIVE    Comment: (NOTE) Detection of p24 may be inhibited by biotin in the sample, causing false negative results in acute infection.    HIV 1/2 Antibodies NON REACTIVE NON REACTIVE   Interpretation (HIV Ag Ab)      A non reactive test result means that HIV 1 or HIV 2 antibodies and HIV 1 p24 antigen were not detected in the specimen.    Comment: Performed at [facility], [address] [place], [place] [address]  Hepatitis B surface antigen      Status: None   Collection Time: 08/16/19  6:04 PM  Result Value Ref Range   Hepatitis B Surface Ag NON REACTIVE NON REACTIVE    Comment: Performed at [facility], [address] [place], [place] [address]  Type and screen [facility]     Status: None   Collection Time: 08/16/19  6:38 PM  Result Value Ref Range   ABO/RH(D) O POS    Antibody Screen NEG    Sample Expiration      08/19/2019,2359 Performed at [facility], [address] [place], [place] [address]   Respiratory Panel by RT PCR (Flu A&B, Covid) - Nasopharyngeal Swab     Status: None   Collection Time: 08/16/19 10:20 PM   Specimen: Nasopharyngeal Swab  Result Value Ref Range   SARS Coronavirus 2 by RT PCR NEGATIVE NEGATIVE    Comment: (NOTE) SARS-CoV-2 target nucleic acids are NOT DETECTED. The SARS-CoV-2 RNA is generally detectable in upper respiratoy specimens during the acute phase of infection. The lowest concentration of SARS-CoV-2 viral copies this assay can detect is 131 copies/mL. A negative result does not preclude SARS-Cov-2 infection and should not be used as the sole basis for treatment or other patient management decisions. A negative result may occur with  improper specimen collection/handling, submission of specimen other than nasopharyngeal swab, presence of viral mutation(s) within the areas targeted by this assay, and inadequate number of viral copies (<131 copies/mL). A negative result must be combined with clinical observations, patient history, and epidemiological information. The expected result is Negative. Fact Sheet for Patients:  https://www.moore.com/ Fact Sheet for Healthcare Providers:  https://www.young.biz/ This test is not yet ap proved or cleared by the [place] FDA and  has been authorized for detection and/or diagnosis of SARS-CoV-2 by FDA under an Emergency Use  Authorization (EUA). This EUA will  remain  in effect (meaning this test can be used) for the duration of the COVID-19 declaration under Section 564(b)(1) of the Act, 21 U.S.C. section 360bbb-3(b)(1), unless the authorization is terminated or revoked sooner.    Influenza A by PCR NEGATIVE NEGATIVE   Influenza B by PCR NEGATIVE NEGATIVE    Comment: (NOTE) The Xpert Xpress SARS-CoV-2/FLU/RSV assay is intended as an aid in  the diagnosis of influenza from Nasopharyngeal swab specimens and  should not be used as a sole basis for treatment. Nasal washings and  aspirates are unacceptable for Xpert Xpress SARS-CoV-2/FLU/RSV  testing. Fact Sheet for Patients: https://www.moore.com/ Fact Sheet for Healthcare Providers: https://www.young.biz/ This test is not yet approved or cleared by the [place] FDA and  has been authorized for detection and/or diagnosis of SARS-CoV-2 by  FDA under an Emergency Use Authorization (EUA). This EUA will remain  in effect (meaning this test can be used) for the duration of the  Covid-19 declaration under Section 564(b)(1) of the Act, 21  U.S.C. section 360bbb-3(b)(1), unless the authorization is  terminated or revoked. Performed at [facility], [address], [place], [place] [address]     Current Facility-Administered Medications  Medication Dose Route Frequency Provider Last Rate Last Admin  . acetaminophen (TYLENOL) tablet 650 mg  650 mg Oral Q4H PRN Hildred Laser, MD      . benzocaine-Menthol (DERMOPLAST) 20-0.5 % topical spray 1 application  1 application Topical PRN Hildred Laser, MD      . Melene Muller ON 08/18/2019] buPROPion (WELLBUTRIN XL) 24 hr tablet 150 mg  150 mg Oral Daily Wai Litt Y, NP      . coconut oil  1 application Topical PRN Hildred Laser, MD      . witch hazel-glycerin (TUCKS) pad 1 application  1 application Topical PRN Hildred Laser, MD       And  . dibucaine (NUPERCAINAL) 1 % rectal ointment 1 application  1  application Rectal PRN Hildred Laser, MD      . diphenhydrAMINE (BENADRYL) capsule 25 mg  25 mg Oral Q6H PRN Hildred Laser, MD      . ibuprofen (ADVIL) tablet 800 mg  800 mg Oral Q6H Hildred Laser, MD   800 mg at 08/17/19 1118  . metroNIDAZOLE (FLAGYL) tablet 500 mg  500 mg Oral Q12H Hildred Laser, MD      . ondansetron [facility]) tablet 4 mg  4 mg Oral Q4H PRN Hildred Laser, MD       Or  . ondansetron (ZOFRAN) injection 4 mg  4 mg Intravenous Q4H PRN Hildred Laser, MD      . oxyCODONE-acetaminophen (PERCOCET/ROXICET) 5-325 MG per tablet 1 tablet  1 tablet Oral Q4H PRN Hildred Laser, MD   1 tablet at 08/16/19 2245  . oxyCODONE-acetaminophen (PERCOCET/ROXICET) 5-325 MG per tablet 2 tablet  2 tablet Oral Q4H PRN Hildred Laser, MD      . senna-docusate (Senokot-S) tablet 2 tablet  2 tablet Oral Q24H Hildred Laser, MD      . simethicone [facility]) chewable tablet 80 mg  80 mg Oral PRN Hildred Laser, MD      . Tdap (BOOSTRIX) injection 0.5 mL  0.5 mL Intramuscular Once Hildred Laser, MD      . zolpidem (AMBIEN) tablet 5 mg  5 mg Oral QHS PRN Hildred Laser, MD   5 mg at 08/17/19 6045    Musculoskeletal: Strength & Muscle Tone: decreased Gait & Station: normal Patient leans:  N/A  Psychiatric Specialty Exam: Physical Exam  Nursing note and vitals reviewed. Constitutional: She is oriented to person, place, and time. She appears well-developed and well-nourished.  HENT:  Head: Normocephalic.  Respiratory: Effort normal.  Musculoskeletal:        General: Normal range of motion.     Cervical back: Normal range of motion.  Neurological: She is alert and oriented to person, place, and time.  Psychiatric: Her speech is normal and behavior is normal. Judgment and thought content normal. Cognition and memory are normal. She exhibits a depressed mood.    Review of Systems  Psychiatric/Behavioral: Positive for dysphoric mood.  All other systems reviewed and are negative.   Blood pressure (!) 115/56,  pulse 85, temperature 98.3 F (36.8 C), temperature source Oral, resp. rate 14, height 5\' 7"  (1.702 m), weight 104.3 kg, last menstrual period 03/12/2019, SpO2 97 %.Body mass index is 36.02 kg/m.  General Appearance: Casual  Eye Contact:  Good  Speech:  Normal Rate  Volume:  Normal  Mood:  Depressed  Affect:  Congruent  Thought Process:  Coherent and Descriptions of Associations: Intact  Orientation:  Full (Time, Place, and Person)  Thought Content:  WDL and Logical  Suicidal Thoughts:  No  Homicidal Thoughts:  No  Memory:  Immediate;   Good Recent;   Good Remote;   Good  Judgement:  Good  Insight:  Good  Psychomotor Activity:  Decreased  Concentration:  Concentration: Good and Attention Span: Good  Recall:  Good  Fund of Knowledge:  Good  Language:  Good  Akathisia:  No  Handed:  Right  AIMS (if indicated):     Assets:  Housing Intimacy Physical Health Resilience Social Support  ADL's:  Intact  Cognition:  WNL  Sleep:      28 year old female consulted for depression related to miscarriage yesterday, history of depression.  No suicidal/homicidal ideations, hallucinations, or subs abuse.  She is interested in going to therapy and potentially starting an antidepressant.  Treatment Plan Summary: Adjustment disorder with depressed mood: -Recommend Wellbutrin XL 150 mg daily -Follow-up with outpatient therapy  -Follow-up with psychiatry, if medication started Disposition: Supportive therapy provided about ongoing stressors.  Waylan Boga, NP 08/17/2019 2:33 PM

## 2019-08-17 NOTE — Progress Notes (Signed)
Patient discharged to home instructions gone over patient verbalized understanding of instructions and when to call and or return to hospital.

## 2019-08-18 ENCOUNTER — Telehealth: Payer: Self-pay

## 2019-08-18 LAB — AEROBIC CULTURE W GRAM STAIN (SUPERFICIAL SPECIMEN): Gram Stain: NONE SEEN

## 2019-08-18 LAB — RUBELLA SCREEN: Rubella: 3.58 index (ref >0.99)

## 2019-08-18 LAB — VARICELLA ZOSTER ANTIBODY, IGG: Varicella IgG: 2188 index (ref >165)

## 2019-08-18 NOTE — Telephone Encounter (Signed)
Patient had a miscarriage on 12/19. Was treated by Dr.Cherry treated the patient during her stay. Patient says she's tried tylenol and yet still in major pain. Would like a prescription. Please call

## 2019-08-19 ENCOUNTER — Telehealth: Payer: Self-pay | Admitting: Obstetrics and Gynecology

## 2019-08-19 LAB — CULTURE, BETA STREP (GROUP B ONLY)

## 2019-08-19 LAB — SURGICAL PATHOLOGY

## 2019-08-19 MED ORDER — ACETAMINOPHEN-CODEINE #3 300-30 MG PO TABS: 1.0000 | ORAL_TABLET | Freq: Four times a day (QID) | ORAL | 0 refills | Status: DC | PRN

## 2019-08-19 NOTE — Telephone Encounter (Signed)
Pt called in requesting a strong meds for her pain in her lower back . Pt is requesting a call from a nurse. Please advise .

## 2019-08-19 NOTE — Telephone Encounter (Signed)
Patient did not realize that prescription was sent to [facility]. She will go there and will pick up.

## 2019-08-19 NOTE — Telephone Encounter (Signed)
[facility] [address]

## 2019-08-19 NOTE — Telephone Encounter (Signed)
Patient reports Tylenol and Ibuprofen are not helping her pain.  Will prescribe short prescription of T#3 until she can see her OB/GYN provider.

## 2019-08-19 NOTE — Telephone Encounter (Signed)
Veronica Ochoa let the patient know that prescription was sent in.

## 2019-08-19 NOTE — Telephone Encounter (Signed)
Please advise 

## 2019-08-21 NOTE — Progress Notes (Signed)
Please inform patient that her placenta did show early signs of infection and that it was a little small.  Also her culture did show Group B strep when the placenta was swabbed.   Dr. Marcelline Mates

## 2019-08-25 ENCOUNTER — Telehealth: Payer: Self-pay

## 2019-08-25 NOTE — Telephone Encounter (Signed)
Pt called no answer LM via VM to call the office to go over test results.  

## 2019-09-05 ENCOUNTER — Ambulatory Visit: Payer: Medicaid Other | Admitting: Physician Assistant

## 2019-09-05 ENCOUNTER — Other Ambulatory Visit: Payer: Self-pay

## 2019-09-05 DIAGNOSIS — Z113 Encounter for screening for infections with a predominantly sexual mode of transmission: Secondary | ICD-10-CM | POA: Diagnosis not present

## 2019-09-05 LAB — WET PREP FOR TRICH, YEAST, CLUE
Trichomonas Exam: NEGATIVE
Yeast Exam: NEGATIVE

## 2019-09-05 NOTE — Progress Notes (Signed)
Patient here for STD screening. Patient states she had a miscarriage on 08/16/2019, states she had a baby at 22 weeks, stillborn. No period since that time. States she recently used ivory pump soap and has dry patches on hands and genital irritation and swelling.Burt Knack, RN

## 2019-09-08 ENCOUNTER — Encounter: Payer: Self-pay | Admitting: Physician Assistant

## 2019-09-08 NOTE — Progress Notes (Signed)
[facility] Department STI clinic/screening visit  Subjective:  Veronica Ochoa is a 29 y.o. female being seen today for an STI screening visit. The patient reports they do have symptoms.  Patient reports that they do not desire a pregnancy in the next year.   They reported they are not interested in discussing contraception today.  Patient's last menstrual period was 03/12/2019 (exact date).   Patient has the following medical conditions:   Patient Active Problem List   Diagnosis Date Noted  . Adjustment disorder with depressed mood 08/17/2019  . Preterm premature rupture of membranes (PPROM) with onset of labor within 24 hours of rupture in second trimester, antepartum 08/16/2019  . 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 Eczema 05/27/2015  . Obesity, unspecified 05/27/2015    Chief Complaint  Patient presents with  . SEXUALLY TRANSMITTED DISEASE    HPI  Patient reports that she has had some "leakage" from her vagina with slight odor and also swelling for about 2-3 days.  States that she may be having a reaction from changing soap.  Reports that she had SAB at [redacted] wk EGA and has not had a normal period since then but is using OCs for [facility].    See flowsheet for further details and programmatic requirements.    The following portions of the patient's history were reviewed and updated as appropriate: allergies, current medications, past medical history, past social history, past surgical history and problem list.  Objective:  There were no vitals filed for this visit.  Physical Exam Constitutional:      General: She is not in acute distress.    Appearance: Normal appearance.  HENT:     Head: Normocephalic and atraumatic.     Comments: No nits, lice, or hair loss. No cervical, supraclavicular or axillary adenopathy.    Mouth/Throat:     Mouth: Mucous membranes are moist.   Pharynx: Oropharynx is clear. No oropharyngeal exudate.  Eyes:     Conjunctiva/sclera: Conjunctivae normal.  Pulmonary:     Effort: Pulmonary effort is normal.  Abdominal:     Palpations: Abdomen is soft. There is no mass.     Tenderness: There is no abdominal tenderness. There is no guarding or rebound.  Genitourinary:    General: Normal vulva.     Rectum: Normal.     Comments: External genitalia/pubic area without nits, lice, edema, erythema, lesions and inguinal adenopathy. At base of left labia majora 1 ~45mm shallow ulcerative lesion, slightly tender. Patient declines speculum exam due to discomfort and swelling.  Slight edema and erythema at vaginal opening. Musculoskeletal:     Cervical back: Neck supple. No tenderness.  Skin:    General: Skin is warm and dry.     Findings: No bruising, erythema, lesion or rash.  Neurological:     Mental Status: She is alert and oriented to person, place, and time.  Psychiatric:        Mood and Affect: Mood normal.        Thought Content: Thought content normal.        Judgment: Judgment normal.      Assessment and Plan:  Veronica Ochoa is a 29 y.o. female presenting to the [facility] Department for STI screening  1. Screening for STD (sexually transmitted disease) Patient into clinic with symptoms. Rec condoms with all sex. Patient with BV on wet mount but declines medicine from [facility]  and states that she has medicine at home given by PCP which she did not use when it was given to her since she was not having any symptoms at that time.  States that she will use the medicine that she has at home. Await test results.  Counseled that RN will call if needs to RTC for further treatment once results are back. - WET PREP FOR TRICH, YEAST, CLUE - Gonococcus culture - Chlamydia/Gonorrhea [place] [facility] - HIV [place] [facility] - Syphilis Serology, [place] [facility] - Virology, [place] [facility]     No follow-ups on file.  No future  appointments.  Matt Holmes, PA

## 2019-09-10 LAB — GONOCOCCUS CULTURE

## 2019-09-18 ENCOUNTER — Telehealth: Payer: Self-pay

## 2019-09-18 DIAGNOSIS — B009 Herpesviral infection, unspecified: Secondary | ICD-10-CM | POA: Insufficient documentation

## 2019-09-18 HISTORY — DX: Herpesviral infection, unspecified: B00.9

## 2019-09-18 NOTE — Telephone Encounter (Signed)
TC from patient.  Verified ID via password. Informed of +HSV2 culture, disease process and tx options.  Patient extremely tearful and upset; states recent;y had a SAB and hope this did not cause it. Patient to call RN back once she decides which regimen she would like. Richmond Campbell, RN

## 2019-09-26 ENCOUNTER — Telehealth: Payer: Self-pay

## 2019-09-26 DIAGNOSIS — B009 Herpesviral infection, unspecified: Secondary | ICD-10-CM

## 2019-09-26 MED ORDER — ACYCLOVIR 800 MG PO TABS: 800.0000 mg | ORAL_TABLET | Freq: Once | ORAL | 11 refills | Status: AC

## 2019-09-26 NOTE — Telephone Encounter (Signed)
TC from patient.  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   [facility] per patient request and [place] [facility] VO Richmond Campbell, RN

## 2019-10-15 LAB — GC/CHLAMYDIA PROBE AMP: Chlamydia trachomatis, NAA: NEGATIVE

## 2019-10-28 ENCOUNTER — Other Ambulatory Visit: Payer: Self-pay

## 2019-10-28 ENCOUNTER — Emergency Department
Admission: EM | Admit: 2019-10-28 | Discharge: 2019-10-28 | Disposition: A | Discharge status: Home / Self Care | Payer: Medicaid Other | Attending: Student | Admitting: Student

## 2019-10-28 DIAGNOSIS — L02415 Cutaneous abscess of right lower limb: Secondary | ICD-10-CM | POA: Insufficient documentation

## 2019-10-28 DIAGNOSIS — L0291 Cutaneous abscess, unspecified: Secondary | ICD-10-CM

## 2019-10-28 DIAGNOSIS — Z91040 Latex allergy status: Secondary | ICD-10-CM | POA: Diagnosis not present

## 2019-10-28 DIAGNOSIS — R2241 Localized swelling, mass and lump, right lower limb: Secondary | ICD-10-CM | POA: Diagnosis present

## 2019-10-28 DIAGNOSIS — Z79899 Other long term (current) drug therapy: Secondary | ICD-10-CM | POA: Insufficient documentation

## 2019-10-28 MED ORDER — SULFAMETHOXAZOLE-TRIMETHOPRIM 800-160 MG PO TABS
1.0000 | ORAL_TABLET | Freq: Once | ORAL | Status: AC
  Administered 2019-10-28: 1 via ORAL
  Filled 2019-10-28: qty 1

## 2019-10-28 MED ORDER — LIDOCAINE HCL (PF) 1 % IJ SOLN
5.0000 mL | Freq: Once | INTRAMUSCULAR | Status: AC
  Administered 2019-10-28: 5 mL
  Filled 2019-10-28: qty 5

## 2019-10-28 MED ORDER — SULFAMETHOXAZOLE-TRIMETHOPRIM 800-160 MG PO TABS: 1.0000 | ORAL_TABLET | Freq: Two times a day (BID) | ORAL | 0 refills | Status: DC

## 2019-10-28 MED ORDER — TRAMADOL HCL 50 MG PO TABS: 50.0000 mg | ORAL_TABLET | Freq: Three times a day (TID) | ORAL | 0 refills | Status: AC | PRN

## 2019-10-28 NOTE — Discharge Instructions (Signed)
You have had your skin abscess drained and packed. Keep the area clean, dry, and covered. Take the antibiotic as directed, and the pain medicine as needed. Follow-up with your provider, local urgent care, or this ED in 3 days for packing removal.

## 2019-10-28 NOTE — ED Notes (Signed)
See triage note  Presents  With possible abscess area to right inner thigh  States she has been using heat and the area started to drain some yesterday

## 2019-10-28 NOTE — ED Triage Notes (Signed)
Pt c/o swollen, raised red area to the right upper inner thigh for the past 2 weeks, states it had a small amount of drainage last night.

## 2019-10-28 NOTE — ED Provider Notes (Signed)
[facility] Emergency Department Provider Note ____________________________________________  Time seen: 54  I have reviewed the triage vital signs and the nursing notes.  HISTORY  Chief Complaint  Abscess  HPI Veronica Ochoa is a 29 y.o. female presents herself to the ED for evaluation of a tender, boil to the inner right thigh. She reports minimal drainage.    History reviewed. No pertinent past medical history.  Patient Active Problem List   Diagnosis Date Noted  . 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 Adjustment disorder with depressed mood 08/17/2019  . Preterm premature rupture of membranes (PPROM) with onset of labor within 24 hours of rupture in second trimester, antepartum 08/16/2019  . 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 Eczema 05/27/2015  . Obesity, unspecified 05/27/2015    History reviewed. No pertinent surgical history.  Prior to Admission medications   Medication Sig Start Date End Date Taking? Authorizing Provider  buPROPion (WELLBUTRIN XL) 150 MG 24 hr tablet Take 1 tablet (150 mg total) by mouth daily. 08/18/19   Hildred Laser, MD  sulfamethoxazole-trimethoprim (BACTRIM DS) 800-160 MG tablet Take 1 tablet by mouth 2 (two) times daily. 10/28/19   Alazay Leicht, Charlesetta Ivory, PA-C  traMADol (ULTRAM) 50 MG tablet Take 1 tablet (50 mg total) by mouth 3 (three) times daily as needed for up to 2 days. 10/28/19 10/30/19  Claritza July, Charlesetta Ivory, PA-C  zolpidem (AMBIEN) 10 MG tablet Take 1 tablet (10 mg total) by mouth at bedtime as needed for sleep. 08/17/19 09/16/19  Hildred Laser, MD    Allergies Vancomycin, Latex, and Morphine and related  Family History  Problem Relation Age of Onset  . Diabetes Father   . Arthritis Maternal Grandmother   . Diabetes Maternal Grandmother   . Asthma Maternal Grandmother     Social History Social  History   Tobacco Use  . Smoking status: Never Smoker  . Smokeless tobacco: Never Used  Substance Use Topics  . Alcohol use: Not Currently    Comment: occasional  . Drug use: No    Review of Systems  Constitutional: Negative for fever. Gastrointestinal: Negative for abdominal pain, vomiting and diarrhea. Genitourinary: Negative for dysuria. Musculoskeletal: Negative for back pain. Skin: Negative for rash. Thigh abscess as above.  Neurological: Negative for headaches, focal weakness or numbness. ____________________________________________  PHYSICAL EXAM:  VITAL SIGNS: ED Triage Vitals  Enc Vitals Group     BP 10/28/19 1023 (!) 150/90     Pulse Rate 10/28/19 1023 (!) 101     Resp 10/28/19 1023 16     Temp 10/28/19 1023 98.6 F (37 C)     Temp Source 10/28/19 1023 Oral     SpO2 10/28/19 1023 100 %     Weight 10/28/19 1020 240 lb (108.9 kg)     Height 10/28/19 1020 5\' 7"  (1.702 m)     Head Circumference --      Peak Flow --      Pain Score 10/28/19 1020 7     Pain Loc --      Pain Edu? --      Excl. in [place]? --     Constitutional: Alert and oriented. Well appearing and in no distress. Head: Normocephalic and atraumatic. Eyes: Conjunctivae are normal. Normal extraocular movements Cardiovascular: Normal rate, regular rhythm. Normal distal pulses. Respiratory: Normal respiratory effort. No wheezes/rales/rhonchi. Gastrointestinal: Soft and nontender. No distention. Musculoskeletal: Nontender  with normal range of motion in all extremities.  Neurologic:  Normal gait without ataxia. Normal speech and language. No gross focal neurologic deficits are appreciated. Skin:  Skin is warm, dry and intact. No rash noted. Right upper inner thigh with a focal area of induration and pointing. No spontaneous drainage is appreciated.  ____________________________________________  PROCEDURES  .Marland KitchenIncision and Drainage  Date/Time: 10/28/2019 10:31 AM Performed by: Melvenia Needles,  PA-C Authorized by: Melvenia Needles, PA-C   Consent:    Consent obtained:  Verbal   Consent given by:  Patient   Risks discussed:  Bleeding, pain and incomplete drainage   Alternatives discussed:  Alternative treatment Location:    Type:  Abscess   Location:  Lower extremity   Lower extremity location:  Leg   Leg location:  R upper leg Pre-procedure details:    Skin preparation:  Betadine Procedure details:    Needle aspiration: no     Incision types:  Single straight   Incision depth:  Dermal   Scalpel blade:  11   Wound management:  Probed and deloculated and irrigated with saline   Drainage:  Purulent and bloody   Drainage amount:  Moderate   Packing materials:  1/4 in iodoform gauze   Amount 1/4" iodoform:  3 Post-procedure details:    Patient tolerance of procedure:  Tolerated well, no immediate complications  ____________________________________________  INITIAL IMPRESSION / ASSESSMENT AND PLAN / ED COURSE  Patient with ED evaluation of an abscess to the thigh. The patient tolerated an I&D procedure. The wound is packed and she is discharged with a prescription for Bactrim and Norco (#6). She will see her provider in 3 days for wound check and packing removal.   Veronica Ochoa was evaluated in Emergency Department on 10/28/2019 for the symptoms described in the history of present illness. She was evaluated in the context of the global COVID-19 pandemic, which necessitated consideration that the patient might be at risk for infection with the SARS-CoV-2 virus that causes COVID-19. Institutional protocols and algorithms that pertain to the evaluation of patients at risk for COVID-19 are in a state of rapid change based on information released by regulatory bodies including the CDC and federal and state organizations. These policies and algorithms were followed during the patient's care in the ED.  I reviewed the patient's prescription history over the last 12 months in  the multi-state controlled substances database(s) that includes [place], [place], [place], [place], [place], [place], [place], [place], [place], [place], [place], [place], [place], and [place].  Results were notable for no current RX.  ____________________________________________  FINAL CLINICAL IMPRESSION(S) / ED DIAGNOSES  Final diagnoses:  Abscess      Carmie End, Dannielle Karvonen, PA-C 10/28/19 1239    Lilia Pro., MD 10/29/19 [phone]

## 2019-10-31 ENCOUNTER — Encounter: Payer: Self-pay | Admitting: Emergency Medicine

## 2019-10-31 ENCOUNTER — Emergency Department
Admission: EM | Admit: 2019-10-31 | Discharge: 2019-10-31 | Disposition: A | Discharge status: Home / Self Care | Payer: Medicaid Other | Attending: Student in an Organized Health Care Education/Training Program | Admitting: Student in an Organized Health Care Education/Training Program

## 2019-10-31 ENCOUNTER — Other Ambulatory Visit: Payer: Self-pay

## 2019-10-31 DIAGNOSIS — Z91040 Latex allergy status: Secondary | ICD-10-CM | POA: Diagnosis not present

## 2019-10-31 DIAGNOSIS — Z79899 Other long term (current) drug therapy: Secondary | ICD-10-CM | POA: Insufficient documentation

## 2019-10-31 DIAGNOSIS — Z5189 Encounter for other specified aftercare: Secondary | ICD-10-CM

## 2019-10-31 DIAGNOSIS — L02415 Cutaneous abscess of right lower limb: Secondary | ICD-10-CM | POA: Insufficient documentation

## 2019-10-31 NOTE — Discharge Instructions (Signed)
Continue taking antibiotics until completely finished.  Keep area clean and dry.  Cover as long as there is any drainage.  You may even use a Band-Aid when there is little to no drainage.

## 2019-10-31 NOTE — ED Triage Notes (Signed)
Patient states she was instructed to come back to the ED 3 days after her last visit to have her wound checked and packing removed to wound to her right inner thigh.

## 2019-10-31 NOTE — ED Notes (Signed)
Unable to obtain e-signature due to computer malfunctions.  Patient verbalizes understanding of discharge instructions and denies further questions at this time.

## 2019-10-31 NOTE — ED Provider Notes (Signed)
[facility] Emergency Department Provider Note  ____________________________________________   First MD Initiated Contact with Patient 10/31/19 [phone]     (approximate)  I have reviewed the triage vital signs and the nursing notes.   HISTORY  Chief Complaint Wound Check   HPI Veronica Ochoa is a 29 y.o. female presents to the ED for wound recheck from a abscess that was open 3 days ago.  Patient continues to take the antibiotic without any difficulty.  She denies any fever or chills.  She rates her pain as a 3/10.      History reviewed. No pertinent past medical history.  Patient Active Problem List   Diagnosis Date Noted  . 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 Adjustment disorder with depressed mood 08/17/2019  . Preterm premature rupture of membranes (PPROM) with onset of labor within 24 hours of rupture in second trimester, antepartum 08/16/2019  . 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 Eczema 05/27/2015  . Obesity, unspecified 05/27/2015    History reviewed. No pertinent surgical history.  Prior to Admission medications   Medication Sig Start Date End Date Taking? Authorizing Provider  buPROPion (WELLBUTRIN XL) 150 MG 24 hr tablet Take 1 tablet (150 mg total) by mouth daily. 08/18/19   Rubie Maid, MD  sulfamethoxazole-trimethoprim (BACTRIM DS) 800-160 MG tablet Take 1 tablet by mouth 2 (two) times daily. 10/28/19   Menshew, Dannielle Karvonen, PA-C  zolpidem (AMBIEN) 10 MG tablet Take 1 tablet (10 mg total) by mouth at bedtime as needed for sleep. 08/17/19 09/16/19  Rubie Maid, MD    Allergies Vancomycin, Latex, and Morphine and related  Family History  Problem Relation Age of Onset  . Diabetes Father   . Arthritis Maternal Grandmother   . Diabetes Maternal Grandmother   . Asthma Maternal Grandmother     Social History Social  History   Tobacco Use  . Smoking status: Never Smoker  . Smokeless tobacco: Never Used  Substance Use Topics  . Alcohol use: Not Currently    Comment: occasional  . Drug use: No    Review of Systems Constitutional: No fever/chills Cardiovascular: Denies chest pain. Respiratory: Denies shortness of breath. Musculoskeletal: Negative for back pain. Skin: Positive abscess right inner thigh. Neurological: Negative for  focal weakness or numbness. ____________________________________________   PHYSICAL EXAM:  VITAL SIGNS: ED Triage Vitals  Enc Vitals Group     BP 10/31/19 0913 114/63     Pulse Rate 10/31/19 0913 88     Resp 10/31/19 0913 16     Temp 10/31/19 0913 98.3 F (36.8 C)     Temp Source 10/31/19 0913 Oral     SpO2 10/31/19 0913 97 %     Weight 10/31/19 0905 240 lb (108.9 kg)     Height 10/31/19 0905 5\' 7"  (1.702 m)     Head Circumference --      Peak Flow --      Pain Score 10/31/19 0914 3     Pain Loc --      Pain Edu? --      Excl. in [place]? --     Constitutional: Alert and oriented. Well appearing and in no acute distress. Eyes: Conjunctivae are normal.  Head: Atraumatic. Neck: No stridor.   Cardiovascular: Normal rate, regular rhythm. Grossly normal heart sounds.  Good peripheral circulation. Respiratory: Normal respiratory effort.  No retractions. Lungs CTAB. Musculoskeletal: Moves  upper and lower extremities that any difficulty normal gait was noted. Neurologic:  Normal speech and language. No gross focal neurologic deficits are appreciated. No gait instability. Skin:  Skin is warm, dry.  There is a healing abscess noted on the medial aspect of the upper right thigh.  No active drainage was noted. Psychiatric: Mood and affect are normal. Speech and behavior are normal.  ____________________________________________   LABS (all labs ordered are listed, but only abnormal results are displayed)  Labs Reviewed - No data to  display   PROCEDURES  Procedure(s) performed (including Critical Care):  Procedures Packing was removed by PA and dressing was applied by RN.  ____________________________________________   INITIAL IMPRESSION / ASSESSMENT AND PLAN / ED COURSE  As part of my medical decision making, I reviewed the following data within the electronic MEDICAL RECORD NUMBER Notes from prior ED visits and [place] Controlled Substance Database     29 year old female presents to the ED for packing removal of an abscess that was opened in the ED 3 days ago.  She continues to take her antibiotics without any difficulty.  Packing was removed was made aware that she would need to change the dressing as needed and to continue watching for signs of infection.  She is encouraged to finish the entire dose of antibiotics and return to the emergency department if any problems.         ____________________________________________   FINAL CLINICAL IMPRESSION(S) / ED DIAGNOSES  Final diagnoses:  Wound check, abscess     ED Discharge Orders    None       Note:  This document was prepared using Dragon voice recognition software and may include unintentional dictation errors.    Tommi Rumps, PA-C 10/31/19 1138    Willy Eddy, MD 10/31/19 [phone]

## 2019-11-20 ENCOUNTER — Emergency Department: Payer: Medicaid Other

## 2019-11-20 ENCOUNTER — Other Ambulatory Visit: Payer: Self-pay

## 2019-11-20 ENCOUNTER — Emergency Department
Admission: EM | Admit: 2019-11-20 | Discharge: 2019-11-20 | Disposition: A | Discharge status: Home / Self Care | Payer: Medicaid Other | Attending: Emergency Medicine | Admitting: Emergency Medicine

## 2019-11-20 DIAGNOSIS — O3461 Maternal care for abnormality of vagina, first trimester: Secondary | ICD-10-CM | POA: Diagnosis not present

## 2019-11-20 DIAGNOSIS — O99891 Other specified diseases and conditions complicating pregnancy: Secondary | ICD-10-CM | POA: Insufficient documentation

## 2019-11-20 DIAGNOSIS — Z91040 Latex allergy status: Secondary | ICD-10-CM | POA: Diagnosis not present

## 2019-11-20 DIAGNOSIS — Z3A01 Less than 8 weeks gestation of pregnancy: Secondary | ICD-10-CM | POA: Diagnosis not present

## 2019-11-20 DIAGNOSIS — O26899 Other specified pregnancy related conditions, unspecified trimester: Secondary | ICD-10-CM

## 2019-11-20 DIAGNOSIS — N898 Other specified noninflammatory disorders of vagina: Secondary | ICD-10-CM | POA: Insufficient documentation

## 2019-11-20 DIAGNOSIS — R1030 Lower abdominal pain, unspecified: Secondary | ICD-10-CM | POA: Diagnosis not present

## 2019-11-20 DIAGNOSIS — Z79899 Other long term (current) drug therapy: Secondary | ICD-10-CM | POA: Insufficient documentation

## 2019-11-20 DIAGNOSIS — R109 Unspecified abdominal pain: Secondary | ICD-10-CM

## 2019-11-20 LAB — COMPREHENSIVE METABOLIC PANEL
ALT: 20 U/L (ref 0–44)
AST: 15 U/L (ref 15–41)
Albumin: 4 g/dL (ref 3.5–5.0)
Alkaline Phosphatase: 47 U/L (ref 38–126)
Anion gap: 8 (ref 5–15)
BUN: 11 mg/dL (ref 6–20)
CO2: 21 mmol/L — ABNORMAL LOW (ref 22–32)
Calcium: 9.1 mg/dL (ref 8.9–10.3)
Chloride: 107 mmol/L (ref 98–111)
Creatinine, Ser: 0.63 mg/dL (ref 0.44–1.00)
GFR calc Af Amer: >60 mL/min (ref >60)
GFR calc non Af Amer: >60 mL/min (ref >60)
Glucose, Bld: 94 mg/dL (ref 70–99)
Potassium: 3.9 mmol/L (ref 3.5–5.1)
Sodium: 136 mmol/L (ref 135–145)
Total Bilirubin: 0.4 mg/dL (ref 0.3–1.2)
Total Protein: 7.1 g/dL (ref 6.5–8.1)

## 2019-11-20 LAB — CBC
HCT: 32.8 % — ABNORMAL LOW (ref 36.0–46.0)
Hemoglobin: 10.7 g/dL — ABNORMAL LOW (ref 12.0–15.0)
MCH: 25.8 pg — ABNORMAL LOW (ref 26.0–34.0)
MCHC: 32.6 g/dL (ref 30.0–36.0)
MCV: 79 fL — ABNORMAL LOW (ref 80.0–100.0)
Platelets: 309 10*3/uL (ref 150–400)
RBC: 4.15 MIL/uL (ref 3.87–5.11)
RDW: 16.3 % — ABNORMAL HIGH (ref 11.5–15.5)
WBC: 9.5 10*3/uL (ref 4.0–10.5)
nRBC: 0 % (ref 0.0–0.2)

## 2019-11-20 LAB — URINALYSIS, COMPLETE (UACMP) WITH MICROSCOPIC
Bacteria, UA: NONE SEEN
Bilirubin Urine: NEGATIVE
Glucose, UA: NEGATIVE mg/dL
Hgb urine dipstick: NEGATIVE
Ketones, ur: NEGATIVE mg/dL
Leukocytes,Ua: NEGATIVE
Nitrite: NEGATIVE
Protein, ur: NEGATIVE mg/dL
Specific Gravity, Urine: 1.024 (ref 1.005–1.030)
pH: 5 (ref 5.0–8.0)

## 2019-11-20 LAB — WET PREP, GENITAL
Clue Cells Wet Prep HPF POC: NONE SEEN
Sperm: NONE SEEN
Trich, Wet Prep: NONE SEEN
Yeast Wet Prep HPF POC: NONE SEEN

## 2019-11-20 LAB — POC URINE PREG, ED: Preg Test, Ur: POSITIVE — AB

## 2019-11-20 LAB — HCG, QUANTITATIVE, PREGNANCY: hCG, Beta Chain, Quant, S: 71190 m[IU]/mL — ABNORMAL HIGH (ref <5)

## 2019-11-20 LAB — LIPASE, BLOOD: Lipase: 20 U/L (ref 11–51)

## 2019-11-20 MED ORDER — METOCLOPRAMIDE HCL 10 MG PO TABS: 10.0000 mg | ORAL_TABLET | Freq: Three times a day (TID) | ORAL | 0 refills | Status: DC | PRN

## 2019-11-20 MED ORDER — SODIUM CHLORIDE 0.9% FLUSH: 3.0000 mL | Freq: Once | INTRAVENOUS | Status: DC

## 2019-11-20 NOTE — ED Provider Notes (Signed)
[facility] Emergency Department Provider Note  ____________________________________________   First MD Initiated Contact with Patient 11/20/19 1405     (approximate)  I have reviewed the triage vital signs and the nursing notes.   HISTORY  Chief Complaint Abdominal Pain    HPI Veronica Ochoa is a 29 y.o. female here with lower abdominal pain.  The patient states that for the last several days, she has had progressively worsening, intermittent, cramp-like lower suprapubic pain.  This seems to come and go randomly.  It feels somewhat like menstrual cramps.  She does admit that she has missed her birth control, and does have unprotected sexual intercourse.  She is also noticed some white vaginal discharge, but denies any vaginal itching or irritation.  No fevers or chills.  She is a G1, P0 with previous miscarriage at 22 weeks.        History reviewed. No pertinent past medical history.  Patient Active Problem List   Diagnosis Date Noted  . 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 Adjustment disorder with depressed mood 08/17/2019  . Preterm premature rupture of membranes (PPROM) with onset of labor within 24 hours of rupture in second trimester, antepartum 08/16/2019  . 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 Eczema 05/27/2015  . Obesity, unspecified 05/27/2015    Past Surgical History:  Procedure Laterality Date  . miscarriage      Prior to Admission medications   Medication Sig Start Date End Date Taking? Authorizing Provider  buPROPion (WELLBUTRIN XL) 150 MG 24 hr tablet Take 1 tablet (150 mg total) by mouth daily. 08/18/19   Rubie Maid, MD  sulfamethoxazole-trimethoprim (BACTRIM DS) 800-160 MG tablet Take 1 tablet by mouth 2 (two) times daily. 10/28/19   Menshew, Dannielle Karvonen, PA-C  zolpidem (AMBIEN) 10 MG tablet Take 1 tablet (10 mg  total) by mouth at bedtime as needed for sleep. 08/17/19 09/16/19  Rubie Maid, MD    Allergies Vancomycin, Latex, and Morphine and related  Family History  Problem Relation Age of Onset  . Diabetes Father   . Arthritis Maternal Grandmother   . Diabetes Maternal Grandmother   . Asthma Maternal Grandmother     Social History Social History   Tobacco Use  . Smoking status: Never Smoker  . Smokeless tobacco: Never Used  Substance Use Topics  . Alcohol use: Not Currently    Comment: occasional  . Drug use: No    Review of Systems  Review of Systems  Constitutional: Positive for fatigue. Negative for fever.  HENT: Negative for congestion and sore throat.   Eyes: Negative for visual disturbance.  Respiratory: Negative for cough and shortness of breath.   Cardiovascular: Negative for chest pain.  Gastrointestinal: Positive for abdominal pain and nausea. Negative for diarrhea and vomiting.  Genitourinary: Negative for flank pain.  Musculoskeletal: Negative for back pain and neck pain.  Skin: Negative for rash and wound.  Neurological: Negative for weakness.  All other systems reviewed and are negative.    ____________________________________________  PHYSICAL EXAM:      VITAL SIGNS: ED Triage Vitals  Enc Vitals Group     BP 11/20/19 1154 137/74     Pulse Rate 11/20/19 1154 77     Resp 11/20/19 1154 18     Temp 11/20/19 1154 98.9 F (37.2 C)     Temp src --      SpO2 11/20/19  1154 100 %     Weight 11/20/19 1155 240 lb (108.9 kg)     Height 11/20/19 1155 5\' 7"  (1.702 m)     Head Circumference --      Peak Flow --      Pain Score 11/20/19 1155 8     Pain Loc --      Pain Edu? --      Excl. in [place]? --      Physical Exam Vitals and nursing note reviewed.  Constitutional:      General: She is not in acute distress.    Appearance: She is well-developed.  HENT:     Head: Normocephalic and atraumatic.  Eyes:     Conjunctiva/sclera: Conjunctivae normal.    Cardiovascular:     Rate and Rhythm: Normal rate and regular rhythm.     Heart sounds: Normal heart sounds.  Pulmonary:     Effort: Pulmonary effort is normal. No respiratory distress.     Breath sounds: No wheezing.  Abdominal:     General: There is no distension.     Tenderness: There is abdominal tenderness in the suprapubic area.  Genitourinary:    Comments: Cervical os is closed.  No overt cervical motion tenderness.  No purulence.  No adnexal pain. Musculoskeletal:     Cervical back: Neck supple.  Skin:    General: Skin is warm.     Capillary Refill: Capillary refill takes less than 2 seconds.     Findings: No rash.  Neurological:     Mental Status: She is alert and oriented to person, place, and time.     Motor: No abnormal muscle tone.       ____________________________________________   LABS (all labs ordered are listed, but only abnormal results are displayed)  Labs Reviewed  COMPREHENSIVE METABOLIC PANEL - Abnormal; Notable for the following components:      Result Value   CO2 21 (*)    All other components within normal limits  CBC - Abnormal; Notable for the following components:   Hemoglobin 10.7 (*)    HCT 32.8 (*)    MCV 79.0 (*)    MCH 25.8 (*)    RDW 16.3 (*)    All other components within normal limits  URINALYSIS, COMPLETE (UACMP) WITH MICROSCOPIC - Abnormal; Notable for the following components:   Color, Urine YELLOW (*)    APPearance CLEAR (*)    All other components within normal limits  POC URINE PREG, ED - Abnormal; Notable for the following components:   Preg Test, Ur Positive (*)    All other components within normal limits  WET PREP, GENITAL  CHLAMYDIA/NGC RT PCR ([facility] ONLY)  LIPASE, BLOOD  HCG, QUANTITATIVE, PREGNANCY    ____________________________________________  EKG:  ________________________________________  RADIOLOGY All imaging, including plain films, CT scans, and ultrasounds, independently reviewed by me,  and interpretations confirmed via formal radiology reads.  ED MD interpretation:     Official radiology report(s): No results found.  ____________________________________________  PROCEDURES   Procedure(s) performed (including Critical Care):  Procedures  ____________________________________________  INITIAL IMPRESSION / MDM / ASSESSMENT AND PLAN / ED COURSE  As part of my medical decision making, I reviewed the following data within the electronic MEDICAL RECORD NUMBER Nursing notes reviewed and incorporated, Old chart reviewed, Notes from prior ED visits, and [place] Controlled Substance Database       *NICOLE DEFINO was evaluated in Emergency Department on 11/20/2019 for the symptoms described in the history of  present illness. She was evaluated in the context of the global COVID-19 pandemic, which necessitated consideration that the patient might be at risk for infection with the SARS-CoV-2 virus that causes COVID-19. Institutional protocols and algorithms that pertain to the evaluation of patients at risk for COVID-19 are in a state of rapid change based on information released by regulatory bodies including the CDC and federal and state organizations. These policies and algorithms were followed during the patient's care in the ED.  Some ED evaluations and interventions may be delayed as a result of limited staffing during the pandemic.*     Medical Decision Making: 29 year old G2, P0 at unknown gestational age here with lower abdominal pain.  Differential includes implantation related pain, spontaneous miscarriage, ectopic.  Her lab work is overall reassuring.  Urinalysis shows no evidence to suggest UTI.  Exam and wet prep is not consistent with PID or TOA.  Pain is not consistent with torsion.  Will check ultrasound for pregnancy identification, with appropriate disposition accordingly.  ____________________________________________  FINAL CLINICAL IMPRESSION(S) / ED  DIAGNOSES  Final diagnoses:  Less than [redacted] weeks gestation of pregnancy     MEDICATIONS GIVEN DURING THIS VISIT:  Medications  sodium chloride flush (NS) 0.9 % injection 3 mL (has no administration in time range)     ED Discharge Orders    None       Note:  This document was prepared using Dragon voice recognition software and may include unintentional dictation errors.   Shaune Pollack, MD 11/20/19 2020

## 2019-11-20 NOTE — ED Triage Notes (Addendum)
Pt here via POV from home with c/o abdominal pain, nausea and vomiting. Pt states the nausea started a week ago and the vomiting just started.  Pt states recent miscarriage in December. Pt has not had period this month yet.  Pt has missed couple doses of BC.  Pt states lower abdominal pain. Pt denies any bleeding. Pt states some creamy white discharge.

## 2019-11-20 NOTE — ED Provider Notes (Signed)
US showed single live intrauterine pregnancy. Discussed this finding with the patient. Discussed importance of follow up with her ob/gyn   Phineas Semen, MD 11/20/19 1747

## 2019-11-20 NOTE — Discharge Instructions (Addendum)
Please contact your ob/gyn doctor to schedule follow up. Please make sure to take prenatal vitamins daily. Please seek medical attention for any high fevers, chest pain, shortness of breath, change in behavior, persistent vomiting, bloody stool or any other new or concerning symptoms.

## 2019-11-20 NOTE — ED Notes (Signed)
Pt to US.

## 2020-05-20 ENCOUNTER — Other Ambulatory Visit: Payer: Self-pay

## 2020-05-20 ENCOUNTER — Ambulatory Visit (INDEPENDENT_AMBULATORY_CARE_PROVIDER_SITE_OTHER): Payer: Medicaid Other | Admitting: Dermatology

## 2020-05-20 ENCOUNTER — Other Ambulatory Visit: Payer: Self-pay | Admitting: Dermatology

## 2020-05-20 DIAGNOSIS — L700 Acne vulgaris: Secondary | ICD-10-CM | POA: Diagnosis not present

## 2020-05-20 DIAGNOSIS — L732 Hidradenitis suppurativa: Secondary | ICD-10-CM | POA: Diagnosis not present

## 2020-05-20 MED ORDER — CLINDAMYCIN PHOSPHATE 1 % EX LOTN: TOPICAL_LOTION | CUTANEOUS | 2 refills | Status: DC

## 2020-05-20 NOTE — Progress Notes (Signed)
   New Patient Visit  Subjective  Veronica Ochoa is a 29 y.o. female who presents for the following: Skin Problem.  Patient here today for spots under her arms. They started about 5 months ago and when they come up they are full of pus then they drain. She saw PCP 1-2 months ago and was given a cream that helps dry it up but only for a few days. Nothing under the breasts or in the groin.  She does have a history of getting boils under the arm sometimes.  About 5 months ago patient started Hydroxyprogesterone caproate injections once weekly to help prevent preterm labor, she is 7 months pregnant.   The following portions of the chart were reviewed this encounter and updated as appropriate:  Tobacco  Allergies  Meds  Problems  Med Hx  Surg Hx  Fam Hx      Review of Systems:  No other skin or systemic complaints except as noted in HPI or Assessment and Plan.  Objective  Well appearing patient in no apparent distress; mood and affect are within normal limits.  A focused examination was performed including scalp, face, neck, chest, axillae, abdomen, arms, hands. Relevant physical exam findings are noted in the Assessment and Plan.  Objective  Axillae: Draining cysts bilateral axillae with some scars.   Objective  face: Trace open comedones   Assessment & Plan  Hidradenitis suppurativa Axillae  Chronic, flared  Arma is currently 7 months pregnant and on Hydroxyprogesterone caproate injections  Start clindamycin lotion (may substitute solution if needed) twice daily to affected areas under arms.  Start [facility] hypochlorous spray one to two times daily.  Consider adding oral antibiotic if becomes more painful or pending culture results.  Will defer at this time since most commonly used antibiotics for this condition are not pregnancy safe  Other Related Procedures Anaerobic and Aerobic Culture  Ordered Medications: clindamycin (CLEOCIN-T) 1 % lotion  Acne  vulgaris face  Treatment deferred at this time  Return in about 1 month (around 06/19/2020).  Anise Salvo, RMA, am acting as scribe for Darden Dates, MD .  Documentation: I have reviewed the above documentation for accuracy and completeness, and I agree with the above.  Darden Dates, MD

## 2020-05-20 NOTE — Patient Instructions (Signed)
Start [facility] hypochlorous spray one to two times daily.

## 2020-05-25 ENCOUNTER — Encounter: Payer: Self-pay | Admitting: Dermatology

## 2020-05-26 ENCOUNTER — Telehealth: Payer: Self-pay

## 2020-05-26 DIAGNOSIS — L732 Hidradenitis suppurativa: Secondary | ICD-10-CM

## 2020-05-26 LAB — ANAEROBIC AND AEROBIC CULTURE

## 2020-05-26 MED ORDER — CLINDAMYCIN PHOSPHATE 1 % EX SOLN: CUTANEOUS | 2 refills | Status: DC

## 2020-05-26 NOTE — Telephone Encounter (Signed)
Thank you :)

## 2020-05-26 NOTE — Telephone Encounter (Signed)
Clindamycin 1% lotion denied by insurance. Per visit note on 05/20/20, sending in solution as a substitute.

## 2020-05-27 ENCOUNTER — Telehealth: Payer: Self-pay

## 2020-05-27 NOTE — Telephone Encounter (Signed)
Patient called and informed of biopsy results, patient verbalized understanding.  

## 2020-05-27 NOTE — Progress Notes (Signed)
Routine flora "normal skin bacteria" --> continue current treatment with clindamycin. Please let Dr. Judie Petit know if patient is having trouble.  MAs please call

## 2020-06-03 ENCOUNTER — Other Ambulatory Visit: Payer: Self-pay

## 2020-06-03 DIAGNOSIS — L732 Hidradenitis suppurativa: Secondary | ICD-10-CM

## 2020-06-03 MED ORDER — CLINDAMYCIN PHOSPHATE 1 % EX SOLN: CUTANEOUS | 2 refills | Status: DC

## 2020-06-03 NOTE — Progress Notes (Signed)
Pt requested different pharmacy  ° °

## 2020-06-23 ENCOUNTER — Ambulatory Visit: Payer: Medicaid Other | Admitting: Dermatology

## 2020-10-20 ENCOUNTER — Ambulatory Visit: Payer: Medicaid Other

## 2021-03-10 ENCOUNTER — Encounter: Payer: Self-pay | Admitting: Emergency Medicine

## 2021-03-10 ENCOUNTER — Other Ambulatory Visit: Payer: Self-pay

## 2021-03-10 ENCOUNTER — Emergency Department
Admission: EM | Admit: 2021-03-10 | Discharge: 2021-03-10 | Disposition: A | Discharge status: Home / Self Care | Payer: Medicaid Other | Attending: Emergency Medicine | Admitting: Emergency Medicine

## 2021-03-10 DIAGNOSIS — Z5189 Encounter for other specified aftercare: Secondary | ICD-10-CM

## 2021-03-10 DIAGNOSIS — Z4800 Encounter for change or removal of nonsurgical wound dressing: Secondary | ICD-10-CM | POA: Insufficient documentation

## 2021-03-10 DIAGNOSIS — L02415 Cutaneous abscess of right lower limb: Secondary | ICD-10-CM | POA: Diagnosis present

## 2021-03-10 DIAGNOSIS — Z91040 Latex allergy status: Secondary | ICD-10-CM | POA: Insufficient documentation

## 2021-03-10 NOTE — ED Notes (Signed)
See triage note  Presents with an abscess area to inner right thigh  States area was lanced 2 days ago  Area in slightly more swollen  Low grade temp on arrival

## 2021-03-10 NOTE — ED Triage Notes (Signed)
Pt c/o abscess to the right inner upper thigh since last Thursday and it started draining around Monday, states [facility] and rx clindamycin on Tuesday.

## 2021-03-10 NOTE — Patient Instructions (Addendum)
DUE TO COVID-19 ONLY ONE VISITOR IS ALLOWED TO COME WITH YOU AND STAY IN THE WAITING ROOM ONLY DURING PRE OP AND PROCEDURE DAY OF SURGERY. THE 1 VISITOR  MAY VISIT WITH YOU AFTER SURGERY IN YOUR PRIVATE ROOM DURING VISITING HOURS ONLY!                Veronica Ochoa     Your procedure is scheduled on: 03/14/21   Report to [facility] Main  Entrance   Report to admitting at  2:00 PM     Call this number if you have problems the morning of surgery [phone]    Remember: Do not eat food after Midnight.  You may have clear liquids until 1:00 PM   CLEAR LIQUID DIET   Foods Allowed                                                                     Foods Excluded  Coffee and tea, regular and decaf                             liquids that you cannot  Plain Jell-O any favor except red or purple                                           see through such as: Fruit ices (not with fruit pulp)                                     milk, soups, orange juice  Iced Popsicles                                    All solid food Carbonated beverages, regular and diet                                    Cranberry, grape and apple juices Sports drinks like Gatorade Lightly seasoned clear broth or consume(fat free) Sugar, honey syrup  Nothing by mouth after 1:00 PM   BRUSH YOUR TEETH MORNING OF SURGERY AND RINSE YOUR MOUTH OUT, NO CHEWING GUM CANDY OR MINTS.     Take these medicines the morning of surgery with A SIP OF WATER: wellbutrin                                 You may not have any metal on your body including hair pins and              piercings  Do not wear jewelry, make-up, lotions, powders or perfumes, deodorant             Do not wear nail polish on your fingernails.  Do not shave  48 hours prior to surgery.  Do not bring valuables to the hospital. [facility] IS NOT             RESPONSIBLE   FOR VALUABLES.  Contacts, dentures or bridgework may not be worn  into surgery.       Patients discharged the day of surgery will not be allowed to drive home.   IF YOU ARE HAVING SURGERY AND GOING HOME THE SAME DAY, YOU MUST HAVE AN ADULT TO DRIVE YOU HOME AND BE WITH YOU FOR 24 HOURS.  YOU MAY GO HOME BY TAXI OR UBER OR ORTHERWISE, BUT AN ADULT MUST ACCOMPANY YOU HOME AND STAY WITH YOU FOR 24 HOURS.  Name and phone number of your driver:  Special Instructions: N/A              Please read over the following fact sheets you were given: _____________________________________________________________________             [facility] - Preparing for Surgery Before surgery, you can play an important role.  Because skin is not sterile, your skin needs to be as free of germs as possible.  You can reduce the number of germs on your skin by washing with CHG (chlorahexidine gluconate) soap before surgery.  CHG is an antiseptic cleaner which kills germs and bonds with the skin to continue killing germs even after washing. Please DO NOT use if you have an allergy to CHG or antibacterial soaps.  If your skin becomes reddened/irritated stop using the CHG and inform your nurse when you arrive at Short Stay. Do not shave (including legs and underarms) for at least 48 hours prior to the first CHG shower.    Please follow these instructions carefully:  1.  Shower with CHG Soap the night before surgery and the  morning of Surgery.  2.  If you choose to wash your hair, wash your hair first as usual with your  normal  shampoo.  3.  After you shampoo, rinse your hair and body thoroughly to remove the  shampoo.                                       4.  Use CHG as you would any other liquid soap.  You can apply chg directly  to the skin and wash                       Gently with a scrungie or clean washcloth.  5.  Apply the CHG Soap to your body ONLY FROM THE NECK DOWN.   Do not use on face/ open                           Wound or open sores. Avoid contact with eyes, ears mouth  and genitals (private parts).                       Wash face,  Genitals (private parts) with your normal soap.             6.  Wash thoroughly, paying special attention to the area where your surgery  will be performed.  7.  Thoroughly rinse your body with warm water from the neck down.  8.  DO NOT shower/wash with your normal soap after using and rinsing off  the CHG Soap.  9.  Pat yourself dry with a clean towel.            10.  Wear clean pajamas.            11.  Place clean sheets on your bed the night of your first shower and do not  sleep with pets. Day of Surgery : Do not apply any lotions/deodorants the morning of surgery.  Please wear clean clothes to the hospital/surgery center.  FAILURE TO FOLLOW THESE INSTRUCTIONS MAY RESULT IN THE CANCELLATION OF YOUR SURGERY PATIENT SIGNATURE_________________________________  NURSE SIGNATURE__________________________________  ________________________________________________________________________

## 2021-03-10 NOTE — ED Provider Notes (Signed)
[facility] Emergency Department Provider Note  ____________________________________________   Event Date/Time   First MD Initiated Contact with Patient 03/10/21 [phone]     (approximate)  I have reviewed the triage vital signs and the nursing notes.   HISTORY  Chief Complaint Abscess   HPI Veronica Ochoa is a 30 y.o. female presents to ED with concerns of abscess and infection to her right inner thigh.  Patient states this began 1 week ago and 2 days ago she was seen at an urgent care where it was opened and drained.  She was given a prescription for clindamycin which she did not start until yesterday.  Patient is currently breast-feeding and was not given a prescription for pain medication.  Patient also reports that she has abscesses frequently and is actually having surgery next week for the axilla area.  She denies any fever or chills.  She rates her pain as 9 out of 10.         History reviewed. No pertinent past medical history.  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 Authorizing Provider  clindamycin (CLEOCIN) 300 MG capsule Take 300 mg by mouth 3 (three) times daily.   Yes [provider]  buPROPion (WELLBUTRIN XL) 150 MG 24 hr tablet Take 1 tablet (150 mg total) by mouth daily. 08/18/19   Hildred Laser, MD  etonogestrel (NEXPLANON) 68 MG IMPL implant 1  each by Subdermal route once.    [provider]  fluconazole (DIFLUCAN) 150 MG tablet Take 150 mg by mouth See admin instructions. Take 150 mg the day before starting clindamycin and take 150 mg after finish 10 day clindamycin cycle.    [provider]  metoCLOPramide (REGLAN) 10 MG tablet Take 1 tablet (10 mg total) by mouth every 8 (eight) hours as needed for nausea or vomiting. 11/20/19 11/19/20  Phineas Semen, MD  zolpidem (AMBIEN) 10 MG tablet Take 1 tablet (10 mg total) by mouth at bedtime as needed for sleep. Patient not taking: Reported on 03/09/2021 08/17/19 09/16/19  Hildred Laser, MD    Allergies Vancomycin, Latex, and Morphine and related  Family History  Problem Relation Age of Onset   Diabetes Father    Arthritis Maternal Grandmother    Diabetes Maternal Grandmother    Asthma Maternal Grandmother     Social History Social History   Tobacco Use   Smoking status: Never   Smokeless tobacco: Never  Substance Use Topics   Alcohol use: Not Currently    Comment: occasional   Drug use: No    Review of Systems Constitutional: No fever/chills Eyes: No visual changes. ENT: No sore throat. Cardiovascular: Denies chest pain. Respiratory: Denies shortness of breath. Gastrointestinal: No abdominal pain.  No nausea, no vomiting.  Musculoskeletal: Negative for back pain. Skin: Positive for abscess. Neurological: Negative for headaches, focal weakness or numbness.  ____________________________________________   PHYSICAL EXAM:  VITAL SIGNS:  ED Triage Vitals  Enc Vitals Group     BP --      Pulse --      Resp --      Temp --      Temp src --      SpO2 --      Weight 03/10/21 0906 262 lb (118.8 kg)     Height 03/10/21 0906 5\' 7"  (1.702 m)     Head Circumference --      Peak Flow --      Pain Score 03/10/21 0905 9     Pain Loc --      Pain Edu? --      Excl. in GC? --     Constitutional: Alert and oriented. Well appearing and in no acute  distress. Eyes: Conjunctivae are normal.  Head: Atraumatic. Neck: No stridor.   Cardiovascular: Normal rate, regular rhythm. Grossly normal heart sounds.  Good peripheral circulation. Respiratory: Normal respiratory effort.  No retractions. Lungs CTAB. Musculoskeletal: No lower extremity tenderness nor edema.  No joint effusions. Neurologic:  Normal speech and language. No gross focal neurologic deficits are appreciated.  Skin:  Skin is warm, dry.  I&D area on the right inner thigh appears to be healing without any complications.  Minimal drainage noted.  No extending cellulitis at present.  Packing was removed and area was cleaned and new dressing applied. Psychiatric: Mood and affect are normal. Speech and behavior are normal.  ____________________________________________   LABS (all labs ordered are listed, but only abnormal results are displayed)  Labs Reviewed - No data to display ____________________________________________  PROCEDURES  Procedure(s) performed (including Critical Care):  Procedures   ____________________________________________   INITIAL IMPRESSION / ASSESSMENT AND PLAN / ED COURSE  As part of my medical decision making, I reviewed the following data within the electronic MEDICAL RECORD NUMBER Notes from prior ED visits and [place] Controlled Substance Database  30 year old female presents to the ED with concerns of right inner upper thigh I&D area from 2 days ago.  Patient currently has been on 2 doses of clindamycin since area was opened at urgent care.  Drain was removed today without any complications.  Patient is encouraged to use warm compresses to the area and continue taking clindamycin until completely finished.  Limited use of Tylenol as she is breast-feeding at this time.  She may also show this to her surgeon next week if any continued concerns.   ____________________________________________   FINAL CLINICAL IMPRESSION(S) / ED DIAGNOSES  Final  diagnoses:  Wound check, abscess     ED Discharge Orders     None        Note:  This document was prepared using Dragon voice recognition software and may include unintentional dictation errors.    26, PA-C 03/10/21 03/12/21    2778, MD 03/10/21 1447

## 2021-03-10 NOTE — Discharge Instructions (Signed)
Continue taking antibiotics until completely finished.  You may now use warm compresses to the area frequently which may increase drainage.  Allow area to dry completely and then put another dressing on it after putting warm compresses on it.  Follow-up with your primary care provider and also keep your appointment for surgery next week.

## 2021-03-11 ENCOUNTER — Encounter (HOSPITAL_COMMUNITY)
Admission: RE | Admit: 2021-03-11 | Discharge: 2021-03-11 | Disposition: A | Discharge status: Home / Self Care | Payer: Medicaid Other | Source: Ambulatory Visit | Attending: Surgery | Admitting: Surgery

## 2021-03-11 ENCOUNTER — Other Ambulatory Visit: Payer: Self-pay

## 2021-03-11 ENCOUNTER — Encounter (HOSPITAL_COMMUNITY): Payer: Self-pay

## 2021-03-11 DIAGNOSIS — Z01812 Encounter for preprocedural laboratory examination: Secondary | ICD-10-CM | POA: Insufficient documentation

## 2021-03-11 HISTORY — DX: Depression, unspecified: F32.A

## 2021-03-11 HISTORY — DX: Anxiety disorder, unspecified: F41.9

## 2021-03-11 LAB — CBC
HCT: 38.1 % (ref 36.0–46.0)
Hemoglobin: 12.3 g/dL (ref 12.0–15.0)
MCH: 27.2 pg (ref 26.0–34.0)
MCHC: 32.3 g/dL (ref 30.0–36.0)
MCV: 84.1 fL (ref 80.0–100.0)
Platelets: 364 10*3/uL (ref 150–400)
RBC: 4.53 MIL/uL (ref 3.87–5.11)
RDW: 14.3 % (ref 11.5–15.5)
WBC: 8.8 10*3/uL (ref 4.0–10.5)
nRBC: 0 % (ref 0.0–0.2)

## 2021-03-11 NOTE — Progress Notes (Signed)
COVID Vaccine Completed:Yes Date COVID Vaccine completed:06/30/20 COVID vaccine manufacturer: Pfizer     PCP - Dr. Orson Eva  Cardiologist - none  Chest x-ray - no EKG - no Stress Test - no ECHO - no Cardiac Cath - no Pacemaker/ICD device last checked:NA  Sleep Study - no CPAP -   Fasting Blood Sugar - NA Checks Blood Sugar _____ times a day  Blood Thinner Instructions:NA Aspirin Instructions: Last Dose:  Anesthesia review: yes  Patient denies shortness of breath, fever, cough and chest pain at PAT appointment Pt has no SOB with any activities. She is a lactating mother of an 21 mo old.  Patient verbalized understanding of instructions that were given to them at the PAT appointment. Patient was also instructed that they will need to review over the PAT instructions again at home before surgery. Yes Pt was seen in the ED for a follow up for an abscess on the inner Rt thigh. 03/10/21 packing was removed and re dressed.  Dressing is clean, dry and intact at PAT visit. I gave the Pt Dr. Norva Riffle number and told her to let him know about it today.

## 2021-03-13 NOTE — H&P (Signed)
Chief Complaint:  painful recurrent axillary hidradenitis  History of Present Illness:  Veronica Ochoa is an 30 y.o. female who was seen in the office with bilateral hidradenitis worse on the left.  She has never had an operation for this and has been treated on multiple occasions with antibiotics.  She presents for EUA with unroofing of fistulas and excision of infection pockets.   Past Medical History:  Diagnosis Date   Anxiety    Depression     Past Surgical History:  Procedure Laterality Date   CESAREAN SECTION  06/2020   miscarriage      No current facility-administered medications for this encounter.   Current Outpatient Medications  Medication Sig Dispense Refill   etonogestrel (NEXPLANON) 68 MG IMPL implant 1 each by Subdermal route once.     fluconazole (DIFLUCAN) 150 MG tablet Take 150 mg by mouth See admin instructions. Take 150 mg the day before starting clindamycin and take 150 mg after finish 10 day clindamycin cycle.     buPROPion (WELLBUTRIN XL) 150 MG 24 hr tablet Take 1 tablet (150 mg total) by mouth daily. 30 tablet 6   clindamycin (CLEOCIN) 300 MG capsule Take 300 mg by mouth 3 (three) times daily.     metoCLOPramide (REGLAN) 10 MG tablet Take 1 tablet (10 mg total) by mouth every 8 (eight) hours as needed for nausea or vomiting. 15 tablet 0   zolpidem (AMBIEN) 10 MG tablet Take 1 tablet (10 mg total) by mouth at bedtime as needed for sleep. (Patient not taking: Reported on 03/09/2021) 20 tablet 0   Vancomycin, Latex, and Morphine and related Family History  Problem Relation Age of Onset   Diabetes Father    Arthritis Maternal Grandmother    Diabetes Maternal Grandmother    Asthma Maternal Grandmother    Social History:   reports that she has never smoked. She has never used smokeless tobacco. She reports previous alcohol use. She reports that she does not use drugs.   REVIEW OF SYSTEMS : Negative except for see problem list  Physical Exam:   currently  breastfeeding. There is no height or weight on file to calculate BMI.  Gen:  WDWN AAF NAD  Neurological: Alert and oriented to person, place, and time. Motor and sensory function is grossly intact  Head: Normocephalic and atraumatic.  Eyes: Conjunctivae are normal. Pupils are equal, round, and reactive to light. No scleral icterus.  Neck: Normal range of motion. Neck supple. No tracheal deviation or thyromegaly present.  Cardiovascular:  SR without murmurs or gallops.  No carotid bruits Breast:  not examined Respiratory: Effort normal.  No respiratory distress. No chest wall tenderness. Breath sounds normal.  No wheezes, rales or rhonchi.  Abdomen:  nontender GU:  not examined Musculoskeletal: Normal range of motion. Extremities are nontender. No cyanosis, edema or clubbing noted--in the axilla are multiple draining abscesses left worse than left Lymphadenopathy: No cervical, preauricular, postauricular or axillary adenopathy is present Skin: Skin is warm and dry. No rash noted. No diaphoresis. No erythema. No pallor. Pscyh: Normal mood and affect. Behavior is normal. Judgment and thought content normal.   LABORATORY RESULTS: No results found for this or any previous visit (from the past 48 hour(s)).   RADIOLOGY RESULTS: No results found.  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      Matt B. Daphine Deutscher, MD, [facility] [phone]  beeper [phone]  03/13/2021 9:46 PM

## 2021-03-14 ENCOUNTER — Encounter (HOSPITAL_COMMUNITY): Payer: Self-pay | Admitting: Surgery

## 2021-03-14 ENCOUNTER — Ambulatory Visit (HOSPITAL_COMMUNITY): Payer: Medicaid Other | Admitting: Certified Registered Nurse Anesthetist

## 2021-03-14 ENCOUNTER — Ambulatory Visit (HOSPITAL_COMMUNITY)
Admission: RE | Admit: 2021-03-14 | Discharge: 2021-03-14 | Disposition: A | Discharge status: Home / Self Care | Payer: Medicaid Other | Attending: Surgery | Admitting: Surgery

## 2021-03-14 ENCOUNTER — Ambulatory Visit (HOSPITAL_COMMUNITY): Payer: Medicaid Other | Admitting: Physician Assistant

## 2021-03-14 ENCOUNTER — Encounter (HOSPITAL_COMMUNITY)
Admission: RE | Disposition: A | Discharge status: Home / Self Care | Payer: Self-pay | Source: Home / Self Care | Attending: Surgery

## 2021-03-14 DIAGNOSIS — L732 Hidradenitis suppurativa: Secondary | ICD-10-CM | POA: Diagnosis present

## 2021-03-14 DIAGNOSIS — Z91040 Latex allergy status: Secondary | ICD-10-CM | POA: Diagnosis not present

## 2021-03-14 DIAGNOSIS — L988 Other specified disorders of the skin and subcutaneous tissue: Secondary | ICD-10-CM | POA: Diagnosis not present

## 2021-03-14 DIAGNOSIS — Z79899 Other long term (current) drug therapy: Secondary | ICD-10-CM | POA: Diagnosis not present

## 2021-03-14 HISTORY — PX: HYDRADENITIS EXCISION: SHX5243

## 2021-03-14 LAB — PREGNANCY, URINE: Preg Test, Ur: NEGATIVE

## 2021-03-14 SURGERY — EXCISION, HIDRADENITIS, AXILLA
Anesthesia: General | Laterality: Bilateral

## 2021-03-14 MED ORDER — KETOROLAC TROMETHAMINE 30 MG/ML IJ SOLN: 30.0000 mg | Freq: Once | INTRAMUSCULAR | Status: AC | PRN

## 2021-03-14 MED ORDER — KETAMINE HCL 10 MG/ML IJ SOLN
INTRAMUSCULAR | Status: DC | PRN
  Administered 2021-03-14: 30 mg via INTRAVENOUS
  Administered 2021-03-14: 20 mg via INTRAVENOUS

## 2021-03-14 MED ORDER — PROPOFOL 10 MG/ML IV BOLUS
INTRAVENOUS | Status: DC | PRN
  Administered 2021-03-14: 50 mg via INTRAVENOUS
  Administered 2021-03-14 (×2): 100 mg via INTRAVENOUS
  Administered 2021-03-14: 50 mg via INTRAVENOUS
  Administered 2021-03-14: 200 mg via INTRAVENOUS

## 2021-03-14 MED ORDER — FENTANYL CITRATE (PF) 100 MCG/2ML IJ SOLN
INTRAMUSCULAR | Status: AC
  Filled 2021-03-14: qty 2

## 2021-03-14 MED ORDER — ONDANSETRON HCL 4 MG/2ML IJ SOLN
INTRAMUSCULAR | Status: DC | PRN
  Administered 2021-03-14: 4 mg via INTRAVENOUS

## 2021-03-14 MED ORDER — CHLORHEXIDINE GLUCONATE CLOTH 2 % EX PADS: 6.0000 | MEDICATED_PAD | Freq: Once | CUTANEOUS | Status: DC

## 2021-03-14 MED ORDER — PROPOFOL 10 MG/ML IV BOLUS
INTRAVENOUS | Status: AC
  Filled 2021-03-14: qty 20

## 2021-03-14 MED ORDER — FENTANYL CITRATE (PF) 100 MCG/2ML IJ SOLN
INTRAMUSCULAR | Status: DC | PRN
  Administered 2021-03-14 (×2): 50 ug via INTRAVENOUS

## 2021-03-14 MED ORDER — CHLORHEXIDINE GLUCONATE 0.12 % MT SOLN
15.0000 mL | Freq: Once | OROMUCOSAL | Status: AC
  Administered 2021-03-14: 15 mL via OROMUCOSAL

## 2021-03-14 MED ORDER — OXYCODONE HCL 5 MG/5ML PO SOLN: 5.0000 mg | Freq: Once | ORAL | Status: AC | PRN

## 2021-03-14 MED ORDER — BUPIVACAINE-EPINEPHRINE (PF) 0.25% -1:200000 IJ SOLN
INTRAMUSCULAR | Status: AC
  Filled 2021-03-14: qty 30

## 2021-03-14 MED ORDER — DEXAMETHASONE SODIUM PHOSPHATE 10 MG/ML IJ SOLN
INTRAMUSCULAR | Status: DC | PRN
  Administered 2021-03-14: 4 mg via INTRAVENOUS

## 2021-03-14 MED ORDER — SODIUM CHLORIDE 0.9 % IV SOLN
2.0000 g | INTRAVENOUS | Status: AC
  Administered 2021-03-14: 2 g via INTRAVENOUS
  Filled 2021-03-14: qty 2

## 2021-03-14 MED ORDER — BUPIVACAINE-EPINEPHRINE 0.25% -1:200000 IJ SOLN
INTRAMUSCULAR | Status: DC | PRN
  Administered 2021-03-14: 20 mL

## 2021-03-14 MED ORDER — ONDANSETRON HCL 4 MG/2ML IJ SOLN
INTRAMUSCULAR | Status: AC
  Filled 2021-03-14: qty 2

## 2021-03-14 MED ORDER — FENTANYL CITRATE (PF) 100 MCG/2ML IJ SOLN
INTRAMUSCULAR | Status: AC
  Administered 2021-03-14: 50 ug via INTRAVENOUS
  Filled 2021-03-14: qty 2

## 2021-03-14 MED ORDER — BACITRACIN-NEOMYCIN-POLYMYXIN OINTMENT TUBE
TOPICAL_OINTMENT | CUTANEOUS | Status: AC
  Filled 2021-03-14: qty 14.17

## 2021-03-14 MED ORDER — PROMETHAZINE HCL 25 MG/ML IJ SOLN: 6.2500 mg | INTRAMUSCULAR | Status: DC | PRN

## 2021-03-14 MED ORDER — LIDOCAINE 2% (20 MG/ML) 5 ML SYRINGE
INTRAMUSCULAR | Status: AC
  Filled 2021-03-14: qty 5

## 2021-03-14 MED ORDER — CEPHALEXIN 500 MG PO CAPS: 500.0000 mg | ORAL_CAPSULE | Freq: Four times a day (QID) | ORAL | 0 refills | Status: AC

## 2021-03-14 MED ORDER — LIDOCAINE 2% (20 MG/ML) 5 ML SYRINGE
INTRAMUSCULAR | Status: DC | PRN
  Administered 2021-03-14: 100 mg via INTRAVENOUS

## 2021-03-14 MED ORDER — KETOROLAC TROMETHAMINE 30 MG/ML IJ SOLN
INTRAMUSCULAR | Status: AC
  Administered 2021-03-14: 30 mg via INTRAVENOUS
  Filled 2021-03-14: qty 1

## 2021-03-14 MED ORDER — ORAL CARE MOUTH RINSE: 15.0000 mL | Freq: Once | OROMUCOSAL | Status: AC

## 2021-03-14 MED ORDER — HYDROCODONE-ACETAMINOPHEN 5-325 MG PO TABS: 1.0000 | ORAL_TABLET | Freq: Four times a day (QID) | ORAL | 0 refills | Status: DC | PRN

## 2021-03-14 MED ORDER — DEXAMETHASONE SODIUM PHOSPHATE 10 MG/ML IJ SOLN
INTRAMUSCULAR | Status: AC
  Filled 2021-03-14: qty 1

## 2021-03-14 MED ORDER — DEXMEDETOMIDINE (PRECEDEX) IN NS 20 MCG/5ML (4 MCG/ML) IV SYRINGE
PREFILLED_SYRINGE | INTRAVENOUS | Status: DC | PRN
  Administered 2021-03-14: 8 ug via INTRAVENOUS
  Administered 2021-03-14: 12 ug via INTRAVENOUS

## 2021-03-14 MED ORDER — ROCURONIUM BROMIDE 10 MG/ML (PF) SYRINGE
PREFILLED_SYRINGE | INTRAVENOUS | Status: AC
  Filled 2021-03-14: qty 10

## 2021-03-14 MED ORDER — KETAMINE HCL 10 MG/ML IJ SOLN
INTRAMUSCULAR | Status: AC
  Filled 2021-03-14: qty 1

## 2021-03-14 MED ORDER — OXYCODONE HCL 5 MG PO TABS
ORAL_TABLET | ORAL | Status: AC
  Administered 2021-03-14: 5 mg via ORAL
  Filled 2021-03-14: qty 1

## 2021-03-14 MED ORDER — BUPIVACAINE HCL 0.25 % IJ SOLN
INTRAMUSCULAR | Status: AC
  Filled 2021-03-14: qty 1

## 2021-03-14 MED ORDER — LACTATED RINGERS IV SOLN: INTRAVENOUS | Status: DC

## 2021-03-14 MED ORDER — OXYCODONE HCL 5 MG PO TABS: 5.0000 mg | ORAL_TABLET | Freq: Once | ORAL | Status: AC | PRN

## 2021-03-14 MED ORDER — 0.9 % SODIUM CHLORIDE (POUR BTL) OPTIME
TOPICAL | Status: DC | PRN
  Administered 2021-03-14: 1000 mL

## 2021-03-14 MED ORDER — FENTANYL CITRATE (PF) 100 MCG/2ML IJ SOLN
25.0000 ug | INTRAMUSCULAR | Status: DC | PRN
  Administered 2021-03-14: 50 ug via INTRAVENOUS

## 2021-03-14 SURGICAL SUPPLY — 33 items
BAG COUNTER SPONGE SURGICOUNT (BAG) IMPLANT
BENZOIN TINCTURE PRP APPL 2/3 (GAUZE/BANDAGES/DRESSINGS) IMPLANT
BLADE HEX COATED 2.75 (ELECTRODE) ×2 IMPLANT
BLADE SURG 15 STRL LF DISP TIS (BLADE) ×1 IMPLANT
BLADE SURG 15 STRL SS (BLADE) ×1
BLADE SURG SZ10 CARB STEEL (BLADE) ×2 IMPLANT
BNDG ELASTIC 4X5.8 VLCR STR LF (GAUZE/BANDAGES/DRESSINGS) ×2 IMPLANT
COVER SURGICAL LIGHT HANDLE (MISCELLANEOUS) ×2 IMPLANT
DECANTER SPIKE VIAL GLASS SM (MISCELLANEOUS) IMPLANT
DRAPE LAPAROTOMY T 102X78X121 (DRAPES) IMPLANT
DRAPE LAPAROTOMY TRNSV 102X78 (DRAPES) IMPLANT
DRAPE POUCH INSTRU U-SHP 10X18 (DRAPES) ×2 IMPLANT
DRAPE SHEET LG 3/4 BI-LAMINATE (DRAPES) ×2 IMPLANT
DRSG PAD ABDOMINAL 8X10 ST (GAUZE/BANDAGES/DRESSINGS) ×6 IMPLANT
ELECT REM PT RETURN 15FT ADLT (MISCELLANEOUS) ×2 IMPLANT
GAUZE PACKING IODOFORM 1/4X15 (PACKING) ×6 IMPLANT
GAUZE SPONGE 4X4 12PLY STRL (GAUZE/BANDAGES/DRESSINGS) IMPLANT
GLOVE SURG UNDER POLY LF SZ7 (GLOVE) IMPLANT
GOWN STRL REUS W/TWL XL LVL3 (GOWN DISPOSABLE) ×2 IMPLANT
KIT BASIN OR (CUSTOM PROCEDURE TRAY) ×2 IMPLANT
KIT TURNOVER KIT A (KITS) ×2 IMPLANT
MARKER SKIN DUAL TIP RULER LAB (MISCELLANEOUS) IMPLANT
NEEDLE HYPO 25X1 1.5 SAFETY (NEEDLE) ×2 IMPLANT
NS IRRIG 1000ML POUR BTL (IV SOLUTION) ×2 IMPLANT
PACK BASIC VI WITH GOWN DISP (CUSTOM PROCEDURE TRAY) IMPLANT
PACK UNIVERSAL I (CUSTOM PROCEDURE TRAY) ×2 IMPLANT
PENCIL SMOKE EVACUATOR (MISCELLANEOUS) ×2 IMPLANT
SPONGE T-LAP 18X18 ~~LOC~~+RFID (SPONGE) ×4 IMPLANT
SPONGE T-LAP 4X18 ~~LOC~~+RFID (SPONGE) ×2 IMPLANT
STAPLER VISISTAT 35W (STAPLE) IMPLANT
SUT VIC AB 4-0 SH 18 (SUTURE) IMPLANT
SYR CONTROL 10ML LL (SYRINGE) ×2 IMPLANT
TOWEL OR NON WOVEN STRL DISP B (DISPOSABLE) ×2 IMPLANT

## 2021-03-14 NOTE — Transfer of Care (Addendum)
Immediate Anesthesia Transfer of Care Note  Patient: Veronica Ochoa  Procedure(s) Performed: EXCISION OF BILATERAL HIDRADENITIS AXILLA  AND RECURRENT ABCESS (Bilateral)  Patient Location: PACU  Anesthesia Type:General  Level of Consciousness: drowsy  Airway & Oxygen Therapy: Patient Spontanous Breathing and Patient connected to face mask oxygen  Post-op Assessment: Report given to RN and Post -op Vital signs reviewed and stable  Post vital signs: Reviewed and stable  Last Vitals:  Vitals Value Taken Time  BP 153/85 03/14/21 1655  Temp    Pulse 102 03/14/21 1657  Resp 19 03/14/21 1657  SpO2 100 % 03/14/21 1657  Vitals shown include unvalidated device data.  Last Pain:  Vitals:   03/14/21 1428  TempSrc:   PainSc: 3       Patients Stated Pain Goal: 3 (03/14/21 1428)  Complications:  Encounter Notable Events  Notable Event Outcome Phase Comment  Laryngospasm N/A Intraprocedure Resolved with Propofol

## 2021-03-14 NOTE — Anesthesia Procedure Notes (Signed)
Procedure Name: LMA Insertion Date/Time: 03/14/2021 3:27 PM Performed by: Ezekiel Ina, CRNA Pre-anesthesia Checklist: Patient identified, Emergency Drugs available, Suction available and Patient being monitored Patient Re-evaluated:Patient Re-evaluated prior to induction Oxygen Delivery Method: Circle System Utilized Preoxygenation: Pre-oxygenation with 100% oxygen Induction Type: IV induction Ventilation: Mask ventilation without difficulty LMA: LMA inserted LMA Size: 4.0 Number of attempts: 1 Airway Equipment and Method: Bite block Placement Confirmation: positive ETCO2 Tube secured with: Tape Dental Injury: Teeth and Oropharynx as per pre-operative assessment

## 2021-03-14 NOTE — Anesthesia Postprocedure Evaluation (Signed)
Anesthesia Post Note  Patient: Veronica Ochoa  Procedure(s) Performed: EXCISION OF BILATERAL HIDRADENITIS AXILLA  AND RECURRENT ABCESS (Bilateral)     Patient location during evaluation: PACU Anesthesia Type: General Level of consciousness: awake and alert Pain management: pain level controlled Vital Signs Assessment: post-procedure vital signs reviewed and stable Respiratory status: spontaneous breathing, nonlabored ventilation, respiratory function stable and patient connected to nasal cannula oxygen Cardiovascular status: blood pressure returned to baseline and stable Postop Assessment: no apparent nausea or vomiting Anesthetic complications: yes   Encounter Notable Events  Notable Event Outcome Phase Comment  Laryngospasm N/A Intraprocedure Resolved with Propofol    Last Vitals:  Vitals:   03/14/21 1745 03/14/21 1800  BP: 132/80 139/81  Pulse: 61 73  Resp: 15 18  Temp:    SpO2: 100% 100%    Last Pain:  Vitals:   03/14/21 1800  TempSrc:   PainSc: Asleep                 Kalani Baray S

## 2021-03-14 NOTE — Interval H&P Note (Signed)
History and Physical Interval Note:  03/14/2021 2:40 PM  Veronica Ochoa  has presented today for surgery, with the diagnosis of AXILLARY HIDRADENITIS.  The various methods of treatment have been discussed with the patient and family. After consideration of risks, benefits and other options for treatment, the patient has consented to  Procedure(s): EXCISION OF BILATERAL HIDRADENITIS AXILLA (Bilateral) as a surgical intervention.  The patient's history has been reviewed, patient examined, no change in status, stable for surgery.  I have reviewed the patient's chart and labs.  Questions were answered to the patient's satisfaction.     Valarie Merino

## 2021-03-14 NOTE — Op Note (Signed)
Veronica Ochoa  May 03, 1991   03/14/2021    PCP:  Fayrene Helper, NP   Surgeon: Wenda Low, MD, FACS  Asst:  None  Anes:  General by LMA  Preop Dx: Bilateral hidradenitis axillaris and right medial thigh lesion Postop Dx: same  Procedure: Bilateral opening and debridement of multiple fistulae and sinuses; packing some with iodophor gauze and neosporin Location Surgery: [facility] Complications:  none  EBL:   minimal cc  Drains: Packing with 1/4 " iodophor  Description of Procedure:  The patient was taken to OR 3 .  After anesthesia was administered and the patient was prepped  with [employer]  and a timeout was performed.  The patient has an allergy to latex and latex free gloves were used.  The left axilla was draped and the multiple tracts were opened, scraped, and cauterized.  The largest sinus was opened and packed.  Other smaller areas were opened and debrided and cauterized.  He was injected with Marcaine with epinephrine.  On the right side this area again was draped with towels and I have discovered 1 sinus and open that up again debriding it with a blade and cauterizing it with the Bovie.  Multiple areas were addressed although the left side was worse than the right.  The patient has an area on the medial aspect of her thigh that is been opened in the drawer emergency room.  I went ahead and opened this up a little bit farther and again debrided with a blade and cauterized with electrocautery we applied Neosporin ointment to it.  She will be given something for pain and antibiotics for 7 days.  The patient tolerated the procedure well and was taken to the PACU in stable condition.     Matt B. Daphine Deutscher, MD, [facility], [place] [phone]

## 2021-03-14 NOTE — Anesthesia Preprocedure Evaluation (Signed)
Anesthesia Evaluation  Patient identified by MRN, date of birth, ID band Patient awake    Reviewed: Allergy & Precautions, NPO status , Patient's Chart, lab work & pertinent test results  Airway Mallampati: II  TM Distance: >3 FB Neck ROM: Full    Dental no notable dental hx.    Pulmonary neg pulmonary ROS,    Pulmonary exam normal breath sounds clear to auscultation       Cardiovascular negative cardio ROS Normal cardiovascular exam Rhythm:Regular Rate:Normal     Neuro/Psych Anxiety Depression negative neurological ROS     GI/Hepatic negative GI ROS, Neg liver ROS,   Endo/Other  Morbid obesity  Renal/GU negative Renal ROS  negative genitourinary   Musculoskeletal negative musculoskeletal ROS (+)   Abdominal   Peds negative pediatric ROS (+)  Hematology negative hematology ROS (+)   Anesthesia Other Findings   Reproductive/Obstetrics negative OB ROS                             Anesthesia Physical Anesthesia Plan  ASA: 2  Anesthesia Plan: General   Post-op Pain Management:    Induction: Intravenous  PONV Risk Score and Plan: 3 and Ondansetron, Dexamethasone and Treatment may vary due to age or medical condition  Airway Management Planned: LMA  Additional Equipment:   Intra-op Plan:   Post-operative Plan: Extubation in OR  Informed Consent: I have reviewed the patients History and Physical, chart, labs and discussed the procedure including the risks, benefits and alternatives for the proposed anesthesia with the patient or authorized representative who has indicated his/her understanding and acceptance.     Dental advisory given  Plan Discussed with: CRNA and Surgeon  Anesthesia Plan Comments:         Anesthesia Quick Evaluation

## 2021-03-15 ENCOUNTER — Encounter (HOSPITAL_COMMUNITY): Payer: Self-pay | Admitting: Surgery

## 2021-05-10 ENCOUNTER — Emergency Department
Admission: EM | Admit: 2021-05-10 | Discharge: 2021-05-10 | Disposition: A | Discharge status: Home / Self Care | Payer: Medicaid Other | Attending: Emergency Medicine | Admitting: Emergency Medicine

## 2021-05-10 ENCOUNTER — Emergency Department: Payer: Medicaid Other

## 2021-05-10 DIAGNOSIS — Z20822 Contact with and (suspected) exposure to covid-19: Secondary | ICD-10-CM | POA: Insufficient documentation

## 2021-05-10 DIAGNOSIS — R070 Pain in throat: Secondary | ICD-10-CM | POA: Diagnosis present

## 2021-05-10 DIAGNOSIS — R112 Nausea with vomiting, unspecified: Secondary | ICD-10-CM | POA: Diagnosis not present

## 2021-05-10 DIAGNOSIS — R197 Diarrhea, unspecified: Secondary | ICD-10-CM | POA: Insufficient documentation

## 2021-05-10 DIAGNOSIS — J029 Acute pharyngitis, unspecified: Secondary | ICD-10-CM | POA: Insufficient documentation

## 2021-05-10 DIAGNOSIS — Z91040 Latex allergy status: Secondary | ICD-10-CM | POA: Diagnosis not present

## 2021-05-10 LAB — CBC
HCT: 38.6 % (ref 36.0–46.0)
Hemoglobin: 12.9 g/dL (ref 12.0–15.0)
MCH: 27.7 pg (ref 26.0–34.0)
MCHC: 33.4 g/dL (ref 30.0–36.0)
MCV: 83 fL (ref 80.0–100.0)
Platelets: 334 10*3/uL (ref 150–400)
RBC: 4.65 MIL/uL (ref 3.87–5.11)
RDW: 14.3 % (ref 11.5–15.5)
WBC: 9.3 10*3/uL (ref 4.0–10.5)
nRBC: 0 % (ref 0.0–0.2)

## 2021-05-10 LAB — BASIC METABOLIC PANEL
Anion gap: 10 (ref 5–15)
BUN: 18 mg/dL (ref 6–20)
CO2: 28 mmol/L (ref 22–32)
Calcium: 9 mg/dL (ref 8.9–10.3)
Chloride: 100 mmol/L (ref 98–111)
Creatinine, Ser: 0.77 mg/dL (ref 0.44–1.00)
GFR, Estimated: >60 mL/min (ref >60)
Glucose, Bld: 84 mg/dL (ref 70–99)
Potassium: 3.6 mmol/L (ref 3.5–5.1)
Sodium: 138 mmol/L (ref 135–145)

## 2021-05-10 LAB — RESP PANEL BY RT-PCR (FLU A&B, COVID) ARPGX2
Influenza A by PCR: NEGATIVE
Influenza B by PCR: NEGATIVE
SARS Coronavirus 2 by RT PCR: NEGATIVE

## 2021-05-10 LAB — GROUP A STREP BY PCR: Group A Strep by PCR: NOT DETECTED

## 2021-05-10 MED ORDER — IOHEXOL 350 MG/ML SOLN
75.0000 mL | Freq: Once | INTRAVENOUS | Status: AC | PRN
  Administered 2021-05-10: 75 mL via INTRAVENOUS
  Filled 2021-05-10: qty 75

## 2021-05-10 MED ORDER — CEPHALEXIN 500 MG PO CAPS: 500.0000 mg | ORAL_CAPSULE | Freq: Two times a day (BID) | ORAL | 0 refills | Status: DC

## 2021-05-10 NOTE — ED Triage Notes (Signed)
NAUSEA, VOMITING, DIARRHEA, SORE THROAT, BODY ACHES , X 3

## 2021-05-10 NOTE — ED Notes (Signed)
Pt here having sore throat, pt showing text from her phone due to pain to speak. Pt is tender with palpation to the tight side of her neck, text reports some fever and cough

## 2021-05-10 NOTE — ED Provider Notes (Signed)
[facility] Emergency Department Provider Note  Time seen: 1:41 PM  I have reviewed the triage vital signs and the nursing notes.   HISTORY  Chief Complaint Sore Throat (SORE THROAT, DIARRHEA, NAUSEA, VOMITING , BODY ACHES )   HPI Shayleigh Bouldin Stalling is a 30 y.o. female with a past medical history of anxiety, depression, presents emergency department for sore throat, nausea vomiting, diarrhea, body aches.  According to the patient for the past 2 to 3 days she has had a sore throat mostly in the right side of her throat.  Making it difficult to talk or swallow due to pain.  Denies any difficulty breathing.  Patient denies any known fever but does state body aches and has been nauseated with occasional episodes of vomiting which she relates to gagging sensation in the back of her throat.  Has noted some loose stool as well.  No cough.   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 Authorizing Provider  buPROPion (WELLBUTRIN XL) 150 MG 24 hr tablet Take 1 tablet (150 mg total) by  mouth daily. 08/18/19   Hildred Laser, MD  etonogestrel (NEXPLANON) 68 MG IMPL implant 1 each by Subdermal route once.    [provider]  HYDROcodone-acetaminophen (NORCO/VICODIN) 5-325 MG tablet Take 1 tablet by mouth every 6 (six) hours as needed for moderate pain. 03/14/21   Luretha Murphy, MD  metoCLOPramide (REGLAN) 10 MG tablet Take 1 tablet (10 mg total) by mouth every 8 (eight) hours as needed for nausea or vomiting. 11/20/19 11/19/20  Phineas Semen, MD  zolpidem (AMBIEN) 10 MG tablet Take 1 tablet (10 mg total) by mouth at bedtime as needed for sleep. Patient not taking: Reported on 03/09/2021 08/17/19 09/16/19  Hildred Laser, MD    Allergies  Allergen Reactions   Vancomycin Hives and Swelling   Latex Swelling   Morphine And Related Hives    Family History  Problem Relation Age of Onset   Diabetes Father    Arthritis Maternal Grandmother    Diabetes Maternal Grandmother    Asthma Maternal Grandmother     Social History Social History   Tobacco Use   Smoking status: Never   Smokeless tobacco: Never  Vaping Use   Vaping Use: Never used  Substance Use Topics   Alcohol use: Not Currently    Comment: occasional   Drug use: No    Review of Systems Constitutional: Negative for fever. ENT: Right-sided throat pain Cardiovascular: Negative for chest pain. Respiratory: Negative for shortness of breath. Gastrointestinal: Negative for abdominal pain.  Positive for nausea vomiting and loose  stool. Musculoskeletal: positive for body aches. Skin: Negative for skin complaints  Neurological: Negative for headache All other ROS negative  ____________________________________________   PHYSICAL EXAM:  VITAL SIGNS: ED Triage Vitals  Enc Vitals Group     BP 05/10/21 1215 127/80     Pulse Rate 05/10/21 1215 (!) 110     Resp 05/10/21 1215 17     Temp 05/10/21 1215 98.8 F (37.1 C)     Temp Source 05/10/21 1215 Oral     SpO2 05/10/21 1215 100 %     Weight  05/10/21 1216 242 lb 8.1 oz (110 kg)     Height 05/10/21 1216 5\' 9"  (1.753 m)     Head Circumference --      Peak Flow --      Pain Score 05/10/21 1216 9     Pain Loc --      Pain Edu? --      Excl. in GC? --    Constitutional: Alert and oriented.  Patient largely shaking her head yes or no to answer questions, for the most part refusing to speak.  When asked specifically she was able to speak with no objective difficulty. Eyes: Normal exam ENT      Head: Normocephalic and atraumatic.      Nose: No congestion/rhinnorhea.      Mouth/Throat: Moderate pharyngeal erythema bilaterally with mild tonsillar hypertrophy, midline uvula.  No exudate Cardiovascular: Normal rate, regular rhythm. No murmur Respiratory: Normal respiratory effort without tachypnea nor retractions. Breath sounds are clear  Gastrointestinal: Soft and nontender. No distention.   Musculoskeletal: Nontender with normal range of motion in all extremities. Neurologic:  Normal speech and language. No gross focal neurologic deficits  Skin:  Skin is warm, dry and intact.  Psychiatric: Mood and affect are normal.   ____________________________________________   RADIOLOGY  CT negative for PTA  ____________________________________________   INITIAL IMPRESSION / ASSESSMENT AND PLAN / ED COURSE  Pertinent labs & imaging results that were available during my care of the patient were reviewed by me and considered in my medical decision making (see chart for details).   Patient presents to the emergency department for right-sided sore throat x2 to 3 days it is gotten worse to the point where it is gagging the patient causing vomiting, difficulty speaking and swallowing.  Exam is very difficult due to her gag reflex however appears that she has equally swollen tonsils, mildly swollen with no exudate but mild pharyngeal erythema bilaterally.  Patient does have moderate right-sided cervical lymphadenopathy none on the left.  Patient  largely refusing to speak however when I specifically asked the patient to speak she was able to speak with no objective difficulty no muffled voice.  However given the significant description of symptoms mostly right-sided we will obtain CT imaging to rule out a PTA given a subpar examination due to gag reflex.    Strep, COVID negative.  CT reassuring.  However given the patient's significant throat discomfort tonsillar hypertrophy we will cover with Keflex as a precaution.  Patient agreeable to plan of care.   SMITH MCNICHOLAS was evaluated in Emergency Department on 05/10/2021 for the symptoms described in the history of present illness. She was evaluated in the context of the global COVID-19 pandemic, which necessitated consideration that the patient might be at risk for infection with the SARS-CoV-2 virus that causes COVID-19. Institutional protocols and algorithms that pertain to the evaluation of patients at risk for COVID-19 are in a state of  rapid change based on information released by regulatory bodies including the CDC and federal and state organizations. These policies and algorithms were followed during the patient's care in the ED.  ____________________________________________   FINAL CLINICAL IMPRESSION(S) / ED DIAGNOSES  Sore throat   Minna Antis, MD 05/10/21 1523

## 2021-08-10 IMAGING — US US OB < 14 WEEKS - US OB TV
1 series · 14 of 28 positions shown · non-contrast
Comparison: None.

CLINICAL DATA: Pelvic pain

EXAM:
OBSTETRIC <14 WK US AND TRANSVAGINAL OB US
TECHNIQUE: Both transabdominal and transvaginal ultrasound examinations were
performed for complete evaluation of the gestation as well as the
maternal uterus, adnexal regions, and pelvic cul-de-sac.
Transvaginal technique was performed to assess early pregnancy.

[Series 1: us ob less than 14 weeks with ob transvaginal · 14 of 76 slices shown]
[im 3/76]
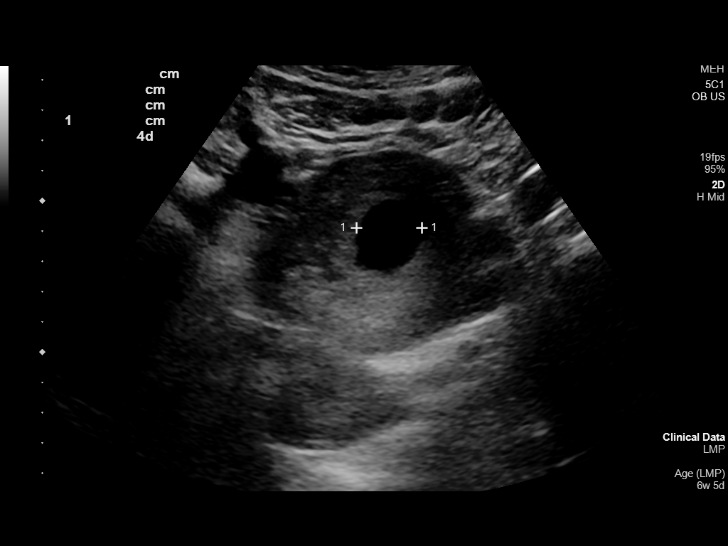
[im 9/76]
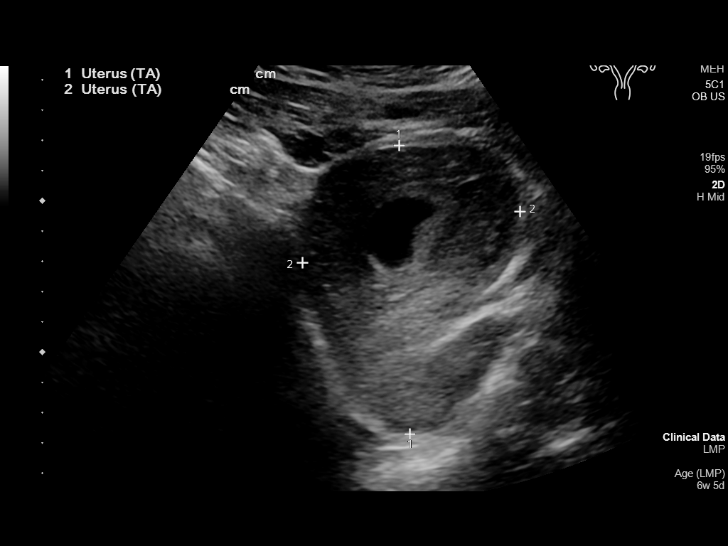
[im 14/76]
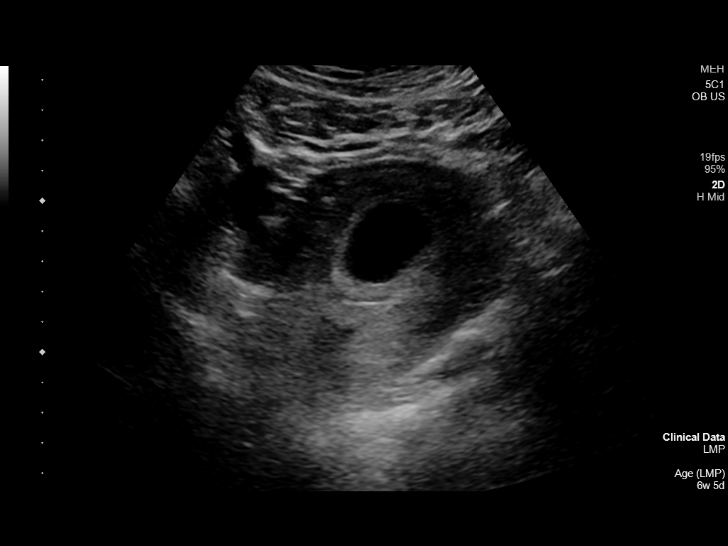
[im 20/76]
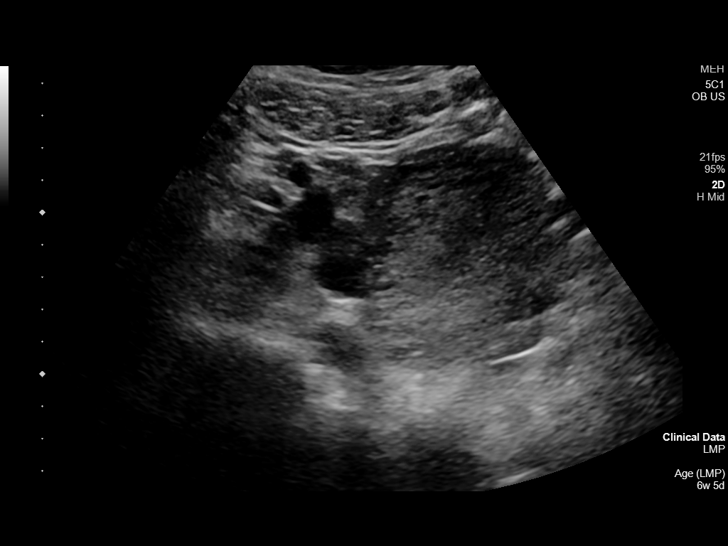
[im 26/76]
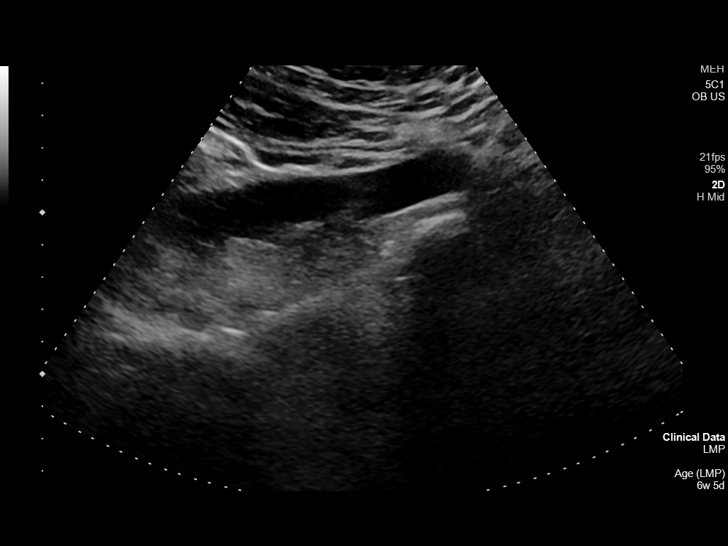
[im 31/76]
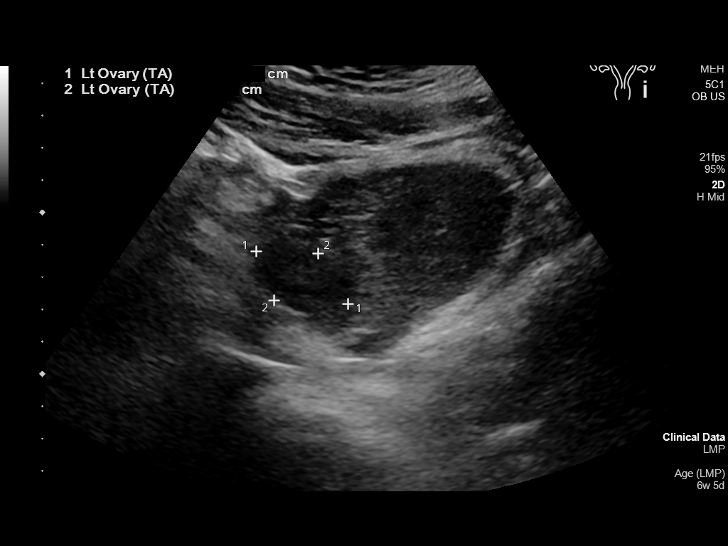
[im 37/76]
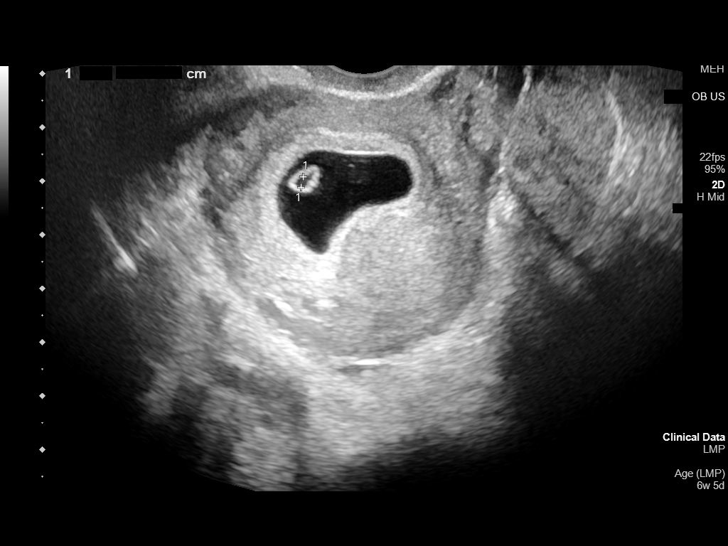
[im 42/76]
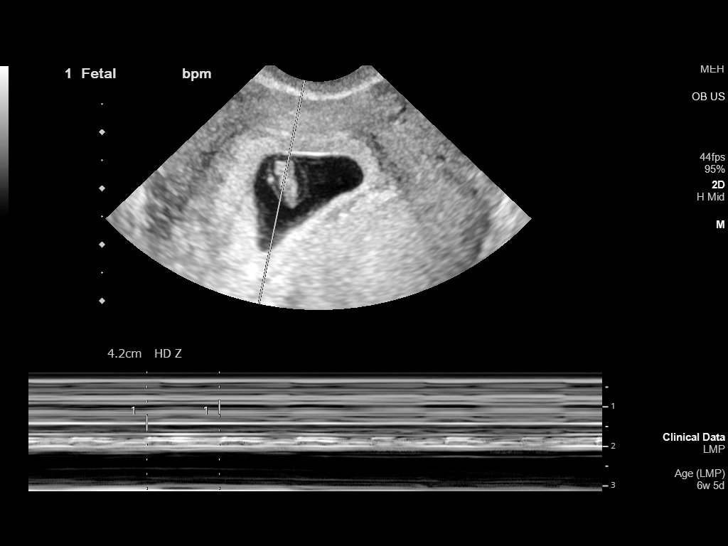
[im 48/76]
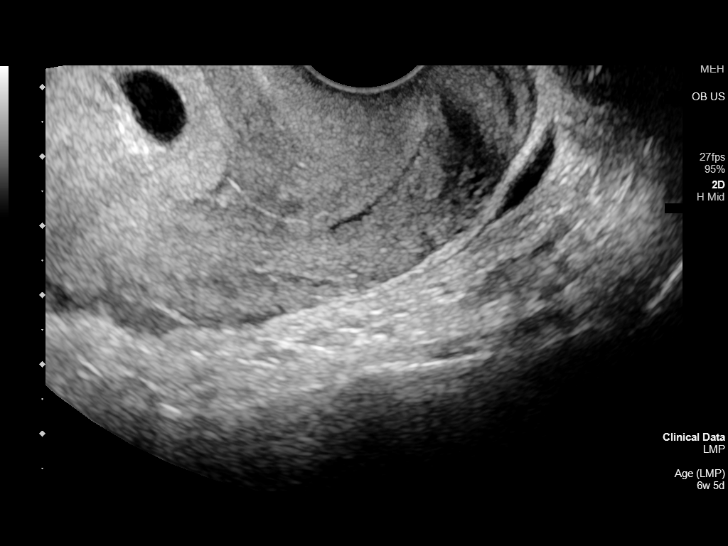
[im 53/76]
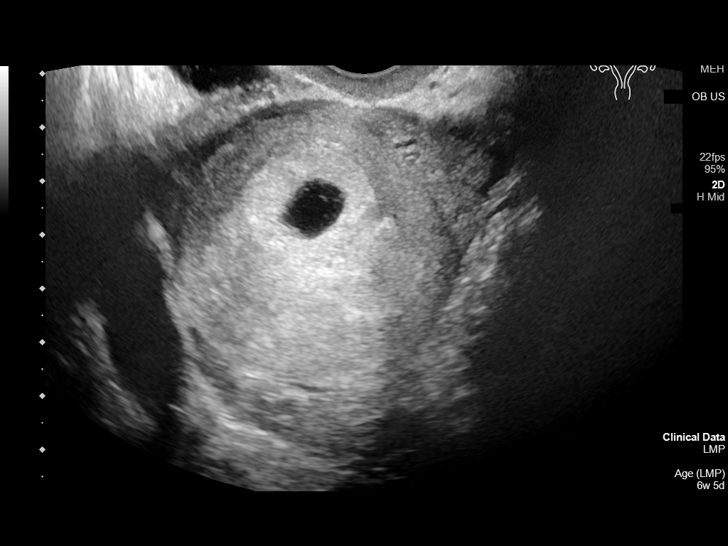
[im 59/76]
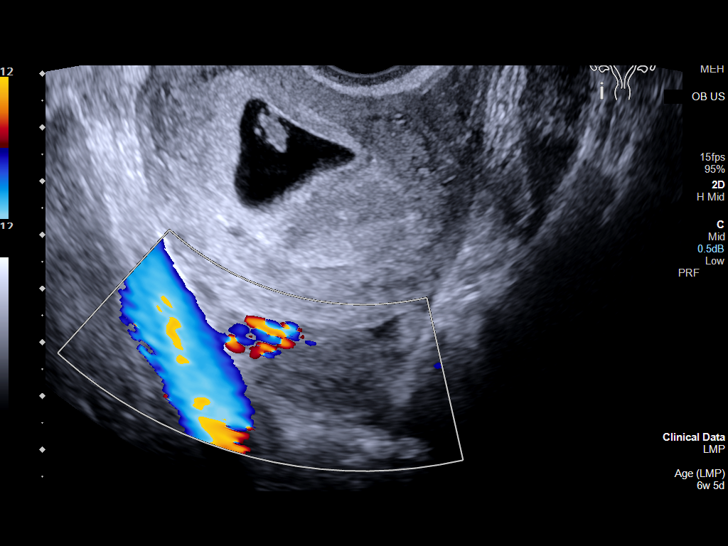
[im 64/76]
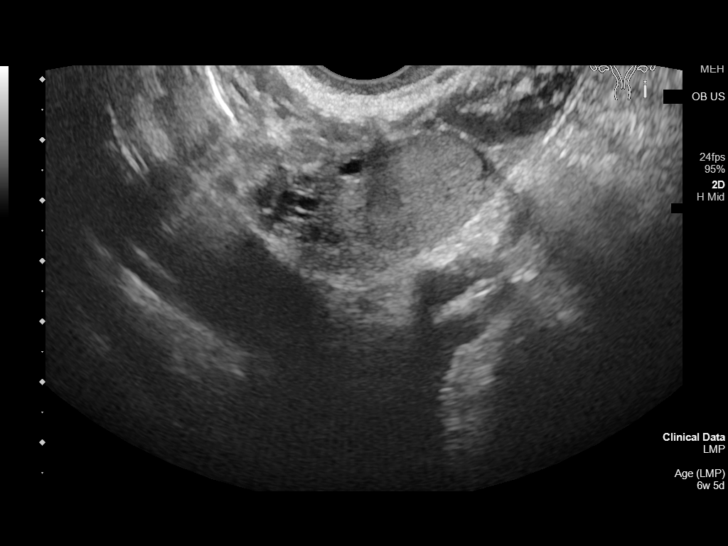
[im 70/76]
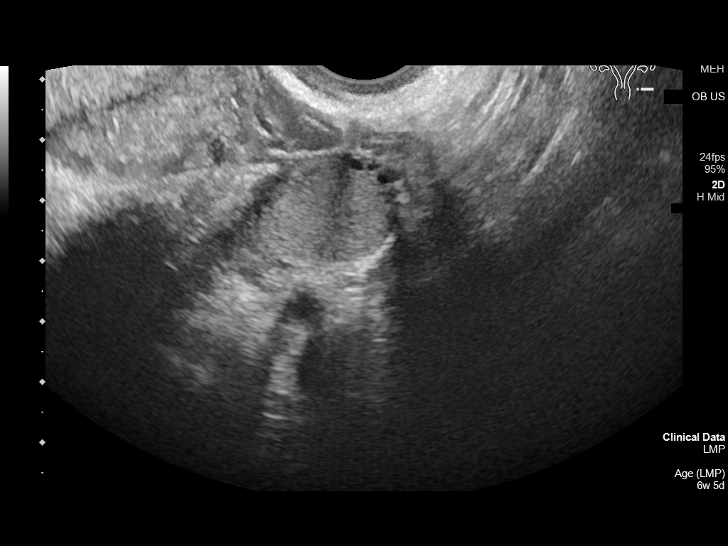
[im 76/76]
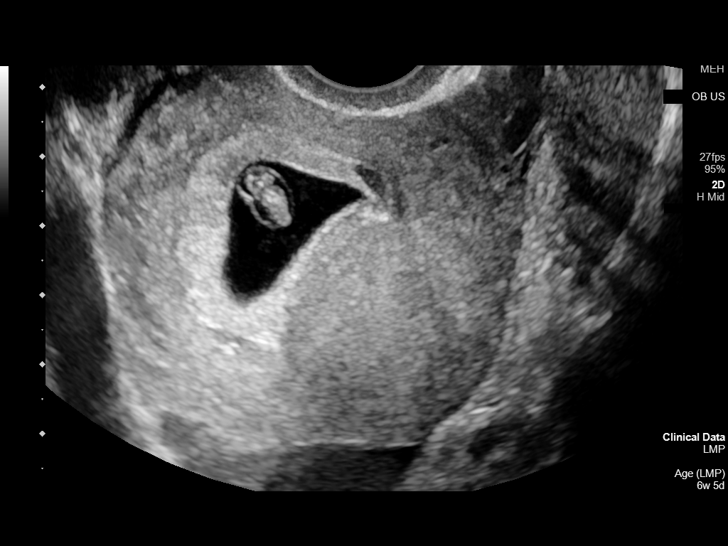

[14 of 28 positions shown; findings below may reference images not displayed]

FINDINGS: Intrauterine gestational sac: Visualized

Yolk sac:  Visualized

Embryo:  Visualized

Cardiac Activity: Visualized

Heart Rate: 141 bpm

CRL:  8 mm  6 w   5 d                  US EDC: July 10, 2020

Subchorionic hemorrhage:  None visualized.

Maternal uterus/adnexae: Cervical os is closed. Left ovary measures
2.4 x 2.2 x 1.8 cm. Right ovary measures 2.9 x 1.5 x 1.4 cm.
Apparent corpus luteum on the left measures 2.2 x 2.3 x 1.8 cm. No
other extrauterine pelvic mass. Trace free pelvic fluid may be
physiologic.
IMPRESSION: Single live intrauterine gestation with estimated gestational age of
7-weeks. No subchorionic hemorrhage. Physiologic corpus luteum on
the left.

## 2022-10-25 ENCOUNTER — Ambulatory Visit: Payer: Medicaid Other | Admitting: Nurse Practitioner

## 2022-10-25 VITALS — BP 122/72 | HR 88 | Ht 67.0 in | Wt 298.0 lb

## 2022-10-25 DIAGNOSIS — L732 Hidradenitis suppurativa: Secondary | ICD-10-CM

## 2022-10-25 DIAGNOSIS — E669 Obesity, unspecified: Secondary | ICD-10-CM

## 2022-10-25 DIAGNOSIS — Z6839 Body mass index (BMI) 39.0-39.9, adult: Secondary | ICD-10-CM

## 2022-10-25 MED ORDER — CLINDAMYCIN PHOSPHATE 1 % EX GEL: Freq: Two times a day (BID) | CUTANEOUS | 0 refills | Status: DC

## 2022-10-25 NOTE — Patient Instructions (Signed)
1) Clinda 2) Derm referral

## 2022-10-25 NOTE — Progress Notes (Signed)
Established Patient Office Visit  Subjective:  Patient ID: Veronica Ochoa, female    DOB: 06-04-1991  Age: 32 y.o. MRN: [id number]  Chief Complaint  Patient presents with   Follow-up    Spot between breast    Acute visit, skin lesion in between breasts.     Past Medical History:  Diagnosis Date   Anxiety    Depression     Social History   Socioeconomic History   Marital status: Single    Spouse name: Not on file   Number of children: 1   Years of education: Not on file   Highest education level: Not on file  Occupational History   Not on file  Tobacco Use   Smoking status: Never   Smokeless tobacco: Never  Vaping Use   Vaping Use: Never used  Substance and Sexual Activity   Alcohol use: Not Currently    Comment: occasional   Drug use: No   Sexual activity: Not on file  Other Topics Concern   Not on file  Social History Narrative   Not on file   Social Determinants of Health   Financial Resource Strain: Not on file  Food Insecurity: Not on file  Transportation Needs: Not on file  Physical Activity: Not on file  Stress: Not on file  Social Connections: Not on file  Intimate Partner Violence: Not At Risk (05/07/2019)   Humiliation, Afraid, Rape, and Kick questionnaire    Fear of Current or Ex-Partner: No    Emotionally Abused: No    Physically Abused: No    Sexually Abused: No    Family History  Problem Relation Age of Onset   Diabetes Father    Arthritis Maternal Grandmother    Diabetes Maternal Grandmother    Asthma Maternal Grandmother     Allergies  Allergen Reactions   Vancomycin Hives and Swelling   Latex Swelling   Morphine And Related Hives    Review of Systems  Constitutional: Negative.   HENT: Negative.    Eyes: Negative.   Respiratory: Negative.    Cardiovascular: Negative.   Gastrointestinal: Negative.   Genitourinary: Negative.   Musculoskeletal: Negative.   Skin: Negative.   Neurological: Negative.    Endo/Heme/Allergies: Negative.   Psychiatric/Behavioral: Negative.    All other systems reviewed and are negative.      Objective:   BP 122/72   Pulse 88   Ht '5\' 7"'$  (1.702 m)   Wt 298 lb (135.2 kg)   SpO2 98%   BMI 46.67 kg/m   Vitals:   10/25/22 0959  BP: 122/72  Pulse: 88  Height: '5\' 7"'$  (1.702 m)  Weight: 298 lb (135.2 kg)  SpO2: 98%  BMI (Calculated): 46.66    Physical Exam Vitals reviewed.  Constitutional:      Appearance: Normal appearance.  HENT:     Head: Normocephalic.     Nose: Nose normal.     Mouth/Throat:     Mouth: Mucous membranes are moist.  Eyes:     Pupils: Pupils are equal, round, and reactive to light.  Cardiovascular:     Rate and Rhythm: Normal rate and regular rhythm.  Pulmonary:     Effort: Pulmonary effort is normal.     Breath sounds: Normal breath sounds.  Abdominal:     General: Bowel sounds are normal.     Palpations: Abdomen is soft.  Musculoskeletal:        General: Normal range of motion.     Cervical  back: Normal range of motion and neck supple.  Skin:    General: Skin is warm and dry.  Neurological:     Mental Status: She is alert and oriented to person, place, and time.  Psychiatric:        Mood and Affect: Mood normal.        Behavior: Behavior normal.      No results found for any visits on 10/25/22.  No results found for this or any previous visit (from the past 2160 hour(s)).    Assessment & Plan:   Problem List Items Addressed This Visit       Other   Obesity, unspecified - Primary   Relevant Medications   clindamycin (CLINDAGEL) 1 % gel   Other Visit Diagnoses     Hidradenitis suppurativa       Relevant Orders   Ambulatory referral to Dermatology       Return if symptoms worsen or fail to improve.   Total time spent: 30 minutes  Evern Bio, NP  10/25/2022

## 2022-11-23 ENCOUNTER — Ambulatory Visit: Payer: Medicaid Other | Admitting: Dermatology

## 2022-12-22 ENCOUNTER — Encounter: Payer: Self-pay | Admitting: Nurse Practitioner

## 2022-12-22 ENCOUNTER — Ambulatory Visit: Payer: Medicaid Other | Admitting: Nurse Practitioner

## 2022-12-22 VITALS — BP 120/70 | Ht 67.5 in | Wt 297.4 lb

## 2022-12-22 DIAGNOSIS — N760 Acute vaginitis: Secondary | ICD-10-CM

## 2022-12-22 DIAGNOSIS — B9689 Other specified bacterial agents as the cause of diseases classified elsewhere: Secondary | ICD-10-CM

## 2022-12-22 MED ORDER — METRONIDAZOLE 500 MG PO TABS: 500.0000 mg | ORAL_TABLET | Freq: Two times a day (BID) | ORAL | 0 refills | Status: DC

## 2022-12-22 MED ORDER — FLUCONAZOLE 150 MG PO TABS: 150.0000 mg | ORAL_TABLET | Freq: Every day | ORAL | 0 refills | Status: DC

## 2022-12-22 NOTE — Progress Notes (Signed)
Established Patient Office Visit  Subjective:  Patient ID: Veronica Ochoa, female    DOB: 01/19/1991  Age: 32 y.o. MRN: [id number]  Chief Complaint  Patient presents with   Acute Visit    Possible bv/yeast    Acute visit.  Possible yeast infection versus BV infection.  White pruritic discharge from vaginal region.  Slightly malodorous.  Not thick.      No other concerns at this time.   Past Medical History:  Diagnosis Date   Anxiety    Depression     Past Surgical History:  Procedure Laterality Date   CESAREAN SECTION  06/2020   HYDRADENITIS EXCISION Bilateral 03/14/2021   Procedure: EXCISION OF BILATERAL HIDRADENITIS AXILLA  AND RECURRENT ABCESS;  Surgeon: Luretha Murphy, MD;  Location: [facility];  Service: General;  Laterality: Bilateral;   miscarriage      Social History   Socioeconomic History   Marital status: Single    Spouse name: Not on file   Number of children: 1   Years of education: Not on file   Highest education level: Not on file  Occupational History   Not on file  Tobacco Use   Smoking status: Never   Smokeless tobacco: Never  Vaping Use   Vaping Use: Never used  Substance and Sexual Activity   Alcohol use: Not Currently    Comment: occasional   Drug use: No   Sexual activity: Not on file  Other Topics Concern   Not on file  Social History Narrative   Not on file   Social Determinants of Health   Financial Resource Strain: Not on file  Food Insecurity: Not on file  Transportation Needs: Not on file  Physical Activity: Not on file  Stress: Not on file  Social Connections: Not on file  Intimate Partner Violence: Not At Risk (05/07/2019)   Humiliation, Afraid, Rape, and Kick questionnaire    Fear of Current or Ex-Partner: No    Emotionally Abused: No    Physically Abused: No    Sexually Abused: No    Family History  Problem Relation Age of Onset   Diabetes Father    Arthritis Maternal Grandmother    Diabetes Maternal Grandmother     Asthma Maternal Grandmother     Allergies  Allergen Reactions   Vancomycin Hives and Swelling   Latex Swelling   Morphine And Related Hives    Review of Systems  Constitutional: Negative.   HENT: Negative.    Eyes: Negative.   Respiratory: Negative.    Cardiovascular: Negative.   Gastrointestinal: Negative.   Genitourinary: Negative.   Musculoskeletal: Negative.   Skin: Negative.   Neurological: Negative.   Endo/Heme/Allergies: Negative.   Psychiatric/Behavioral: Negative.         Objective:   BP 120/70   Ht 5' 7.5" (1.715 m)   Wt 297 lb 6.4 oz (134.9 kg)   BMI 45.89 kg/m   Vitals:   12/22/22 1507  BP: 120/70  Height: 5' 7.5" (1.715 m)  Weight: 297 lb 6.4 oz (134.9 kg)  BMI (Calculated): 45.87    Physical Exam Vitals reviewed.  Constitutional:      Appearance: Normal appearance.  HENT:     Head: Normocephalic.     Nose: Nose normal.     Mouth/Throat:     Mouth: Mucous membranes are moist.  Eyes:     Pupils: Pupils are equal, round, and reactive to light.  Cardiovascular:     Rate and Rhythm: Normal rate  and regular rhythm.  Pulmonary:     Effort: Pulmonary effort is normal.     Breath sounds: Normal breath sounds.  Abdominal:     General: Bowel sounds are normal.     Palpations: Abdomen is soft.  Musculoskeletal:        General: Normal range of motion.     Cervical back: Normal range of motion and neck supple.  Skin:    General: Skin is warm and dry.  Neurological:     Mental Status: She is alert and oriented to person, place, and time.  Psychiatric:        Mood and Affect: Mood normal.        Behavior: Behavior normal.      No results found for any visits on 12/22/22.  No results found for this or any previous visit (from the past 2160 hour(s)).    Assessment & Plan:   Problem List Items Addressed This Visit       Genitourinary   Acute vaginitis - Primary   Relevant Medications   metroNIDAZOLE (FLAGYL) 500 MG tablet    No  follow-ups on file.   Total time spent: 35 minutes  Orson Eva, NP  12/22/2022

## 2022-12-22 NOTE — Patient Instructions (Signed)
1) 1-2 weeks follow up for weight loss discussion

## 2022-12-27 ENCOUNTER — Ambulatory Visit: Payer: Medicaid Other | Admitting: Dermatology

## 2022-12-27 VITALS — BP 110/70 | HR 80

## 2022-12-27 DIAGNOSIS — D485 Neoplasm of uncertain behavior of skin: Secondary | ICD-10-CM

## 2022-12-27 DIAGNOSIS — L570 Actinic keratosis: Secondary | ICD-10-CM | POA: Diagnosis not present

## 2022-12-27 DIAGNOSIS — L732 Hidradenitis suppurativa: Secondary | ICD-10-CM | POA: Diagnosis not present

## 2022-12-27 DIAGNOSIS — L821 Other seborrheic keratosis: Secondary | ICD-10-CM | POA: Diagnosis not present

## 2022-12-27 DIAGNOSIS — L820 Inflamed seborrheic keratosis: Secondary | ICD-10-CM | POA: Diagnosis not present

## 2022-12-27 MED ORDER — CLINDAMYCIN PHOSPHATE 1 % EX SOLN: Freq: Two times a day (BID) | CUTANEOUS | 6 refills | Status: DC

## 2022-12-27 NOTE — Patient Instructions (Addendum)
Start Clorhexidine wash daily in the shower. Avoid contact with eyes and vaginal area. OR Recommend [facility] Hypochlorous Spray (found in the wound care section) OR Cln brand Acne or Sports wash. The [facility] Hypochlorous Spray can be sprayed on daily and left on. The Cln wash should be applied to the affected area daily for at least 30 seconds and then rinsed off. If you are using clindamycin solution or lotion or another topical antibiotic to treat acne, using a hypochlorous product may help lower the risk of antibiotic resistant bacteria.    Due to recent changes in healthcare laws, you may see results of your pathology and/or laboratory studies on MyChart before the doctors have had a chance to review them. We understand that in some cases there may be results that are confusing or concerning to you. Please understand that not all results are received at the same time and often the doctors may need to interpret multiple results in order to provide you with the best plan of care or course of treatment. Therefore, we ask that you please give [place] 2 business days to thoroughly review all your results before contacting the office for clarification. Should we see a critical lab result, you will be contacted sooner.   If You Need Anything After Your Visit  If you have any questions or concerns for your doctor, please call our main line at [phone] and press option 4 to reach your doctor's medical assistant. If no one answers, please leave a voicemail as directed and we will return your call as soon as possible. Messages left after 4 pm will be answered the following business day.   You may also send [place] a message via MyChart. We typically respond to MyChart messages within 1-2 business days.  For prescription refills, please ask your pharmacy to contact our office. Our fax number is [phone].  If you have an urgent issue when the clinic is closed that cannot wait until the next business day, you  can page your doctor at the number below.    Please note that while we do our best to be available for urgent issues outside of office hours, we are not available 24/7.   If you have an urgent issue and are unable to reach [place], you may choose to seek medical care at your doctor's office, retail clinic, urgent care center, or emergency room.  If you have a medical emergency, please immediately call 911 or go to the emergency department.  Pager Numbers  - Dr. Gwen Pounds: [phone]  - Dr. Neale Burly: [phone]  - Dr. Roseanne Reno: [phone]  In the event of inclement weather, please call our main line at [phone] for an update on the status of any delays or closures.  Dermatology Medication Tips: Please keep the boxes that topical medications come in in order to help keep track of the instructions about where and how to use these. Pharmacies typically print the medication instructions only on the boxes and not directly on the medication tubes.   If your medication is too expensive, please contact our office at [phone] option 4 or send [place] a message through MyChart.   We are unable to tell what your co-pay for medications will be in advance as this is different depending on your insurance coverage. However, we may be able to find a substitute medication at lower cost or fill out paperwork to get insurance to cover a needed medication.   If a prior authorization is required to get your medication  covered by your insurance company, please allow [place] 1-2 business days to complete this process.  Drug prices often vary depending on where the prescription is filled and some pharmacies may offer cheaper prices.  The website www.goodrx.com contains coupons for medications through different pharmacies. The prices here do not account for what the cost may be with help from insurance (it may be cheaper with your insurance), but the website can give you the price if you did not use any insurance.  -  You can print the associated coupon and take it with your prescription to the pharmacy.  - You may also stop by our office during regular business hours and pick up a GoodRx coupon card.  - If you need your prescription sent electronically to a different pharmacy, notify our office through [facility] or by phone at [phone] option 4.     Si Usted Necesita Algo Despus de Su Visita  Tambin puede enviarnos un mensaje a travs de Clinical cytogeneticist. Por lo general respondemos a los mensajes de MyChart en el transcurso de 1 a 2 das hbiles.  Para renovar recetas, por favor pida a su farmacia que se ponga en contacto con nuestra oficina. Annie Sable de fax es [place] [phone].  Si tiene un asunto urgente cuando la clnica est cerrada y que no puede esperar hasta el siguiente da hbil, puede llamar/localizar a su doctor(a) al nmero que aparece a continuacin.   Por favor, tenga en cuenta que aunque hacemos todo lo posible para estar disponibles para asuntos urgentes fuera del horario de [place], no estamos disponibles las 24 horas del da, los 7 [address]  [address] de la [place].   Si tiene un problema urgente y no puede comunicarse con nosotros, puede optar por buscar atencin mdica  en el consultorio de su doctor(a), en una clnica privada, en un centro de atencin urgente o en una sala de emergencias.  Si tiene Engineer, drilling, por favor llame inmediatamente al 911 o vaya a la sala de emergencias.  Nmeros de bper  - Dr. Gwen Pounds: [phone]  - Dra. Moye: [phone]  - Dra. Roseanne Reno: [phone]  En caso de inclemencias del [place], por favor llame a Lacy Duverney principal al [phone] para una actualizacin sobre el [place] de cualquier retraso o cierre.  Consejos para la medicacin en dermatologa: Por favor, guarde las cajas en las que vienen los medicamentos de uso tpico para ayudarle a seguir las instrucciones sobre dnde y cmo usarlos. Las farmacias generalmente imprimen  las instrucciones del medicamento slo en las cajas y no directamente en los tubos del [place].   Si su medicamento es muy caro, por favor, pngase en contacto con Rolm Gala llamando al [phone] y presione la opcin 4 o envenos un mensaje a travs de Clinical cytogeneticist.   No podemos decirle cul ser su copago por los medicamentos por adelantado ya que esto es diferente dependiendo de la cobertura de su seguro. Sin embargo, es posible que podamos encontrar un medicamento sustituto a Audiological scientist un formulario para que el seguro cubra el medicamento que se considera necesario.   Si se requiere una autorizacin previa para que su compaa de seguros [place] su medicamento, por favor permtanos de 1 a 2 das hbiles para completar [address]  Los precios de los medicamentos varan con frecuencia dependiendo del Environmental consultant de dnde se surte la receta y alguna farmacias pueden ofrecer precios ms baratos.  El sitio web www.goodrx.com tiene cupones para medicamentos de Health and safety inspector. Los precios aqu  en cuenta lo que podra costar con la ayuda del seguro (puede ser ms barato con su seguro), pero el sitio web puede darle el precio si no utiliz ningn seguro.  - Puede imprimir el cupn correspondiente y llevarlo con su receta a la farmacia.  - Tambin puede pasar por nuestra oficina durante el horario de atencin regular y recoger una tarjeta de cupones de GoodRx.  - Si necesita que su receta se enve electrnicamente a una farmacia diferente, informe a nuestra oficina a travs de MyChart de [place] o por telfono llamando al [phone] y presione la opcin 4.  

## 2022-12-27 NOTE — Progress Notes (Addendum)
Follow Up Visit   Subjective  Veronica Ochoa is a 32 y.o. female who presents for the following: bumps - HS axillary, inframammary, has flared worse over the past year. PCP rx'ed topical cream, but it wasn't covered by insurance. Currently she is uses Lume antiperspirant. She did have surgery for HS in her axillary area. Flares during warmer summer months, regularly once a month, not associated with menses.   The following portions of the chart were reviewed this encounter and updated as appropriate: medications, allergies, medical history  Review of Systems:  No other skin or systemic complaints except as noted in HPI or Assessment and Plan.  Objective  Well appearing patient in no apparent distress; mood and affect are within normal limits.  A focused examination was performed of the following areas:  The inframammary and axillary areas  Relevant exam findings are noted in the Assessment and Plan.  R neck 0.2 cm hyperpigmented verrucous papule.      Ant neck 0.2 cm thin verrucous papule.        Assessment & Plan   Neoplasm of uncertain behavior of skin (2) R neck  Epidermal / dermal shaving  Lesion diameter (cm):  0.2 Informed consent: discussed and consent obtained   Patient was prepped and draped in usual sterile fashion: area prepped with alcohol. Anesthesia: the lesion was anesthetized in a standard fashion   Anesthetic:  1% lidocaine w/ epinephrine 1-100,000 buffered w/ 8.4% NaHCO3 Instrument used: scissors   Instrument used comment:  Snip excision Hemostasis achieved with: pressure, aluminum chloride and electrodesiccation   Outcome: patient tolerated procedure well   Post-procedure details: wound care instructions given   Post-procedure details comment:  Ointment and bandaid applied  Specimen 1 - Surgical pathology Differential Diagnosis: verruca vs SK  Check Margins: No  Ant neck  Epidermal / dermal shaving  Lesion diameter (cm):   0.2 Informed consent: discussed and consent obtained   Patient was prepped and draped in usual sterile fashion: area prepped with alcohol. Anesthesia: the lesion was anesthetized in a standard fashion   Anesthetic:  1% lidocaine w/ epinephrine 1-100,000 buffered w/ 8.4% NaHCO3 Instrument used: scissors   Instrument used comment:  Snip excision Hemostasis achieved with: pressure, aluminum chloride and electrodesiccation   Outcome: patient tolerated procedure well   Post-procedure details: wound care instructions given   Post-procedure details comment:  Ointment and bandaid applied  Specimen 2 - Surgical pathology Differential Diagnosis: verruca vs SK Check Margins: No   HIDRADENITIS SUPPURATIVA Exam: Few scars R axilla, small scar between the breast, L axilla with scarring, scarring and double open comedones of the groin.   Chronic and persistent condition with duration or expected duration over one year. Condition is symptomatic/ bothersome to patient. Not currently at goal.  Hidradenitis Suppurativa is a chronic; persistent; non-curable, but treatable condition due to abnormal inflamed sweat glands in the body folds (axilla, inframammary, groin, medial thighs), causing recurrent painful draining cysts and scarring. It can be associated with severe scarring acne and cysts; also abscesses and scarring of scalp. The goal is control and prevention of flares, as it is not curable. Scars are permanent and can be thickened. Treatment may include daily use of topical medication and oral antibiotics.  Oral isotretinoin may also be helpful.  For some cases, Humira or Cosentyx (biologic injections) may be prescribed to decrease the inflammatory process and prevent flares.  When indicated, inflamed cysts may also be treated surgically.  Treatment Plan:  Discussed topical tx  of Clindamycin and chlorhexidine/Hibiclens wash, oral tx of Spironolactone, doxycycline, metformin. Pt not currently pregnant or  breast feeding. LHR removal may be helpful, but not covered by insurance. She defers oral therapy at this time.   Start Clindamycin solution to aa's BID. If patient continues to flare recommend Spironolactone.   SEBORRHEIC KERATOSIS - neck, chest - Stuck-on, waxy, tan-brown papules and/or plaques  - Benign-appearing - Discussed benign etiology and prognosis. - Observe - Call for any changes   Return for HS follow up in 2-3 months.  Maylene Roes, CMA, am acting as scribe for Darden Dates, MD .  Documentation: I have reviewed the above documentation for accuracy and completeness, and I agree with the above.  Darden Dates, MD

## 2022-12-29 ENCOUNTER — Ambulatory Visit: Payer: Medicaid Other | Admitting: Nurse Practitioner

## 2022-12-31 ENCOUNTER — Telehealth: Payer: Self-pay | Admitting: Dermatology

## 2022-12-31 NOTE — Telephone Encounter (Signed)
Please let Veronica Ochoa know that there are some studies suggesting benefit of botox for hidradenitis, but they are not high quality and so it not considered a standard treatment option at least for now. If she tried the other standard options and nothing worked, we could appeal to her insurance company to try to get them to pay for it, but most likely they would not pay even if she failed other options since it is an expensive treatment.   Thank you!

## 2023-01-01 NOTE — Telephone Encounter (Signed)
Called and spoke with patient regarding provider's response. Patient states she has tried various topicals, washes and antiperspirants. She will contact her insurance to see if she find out if botox would be covered and call us back.

## 2023-01-04 ENCOUNTER — Encounter: Payer: Self-pay | Admitting: Nurse Practitioner

## 2023-01-04 ENCOUNTER — Ambulatory Visit: Payer: Medicaid Other | Admitting: Nurse Practitioner

## 2023-01-04 ENCOUNTER — Telehealth: Payer: Self-pay

## 2023-01-04 VITALS — BP 106/74 | HR 52 | Ht 68.0 in | Wt 296.0 lb

## 2023-01-04 DIAGNOSIS — E663 Overweight: Secondary | ICD-10-CM | POA: Diagnosis not present

## 2023-01-04 DIAGNOSIS — N760 Acute vaginitis: Secondary | ICD-10-CM

## 2023-01-04 MED ORDER — PHENTERMINE HCL 37.5 MG PO TABS: 37.5000 mg | ORAL_TABLET | Freq: Every day | ORAL | 2 refills | Status: DC

## 2023-01-04 NOTE — Telephone Encounter (Signed)
Discussed pathology results. Patient voiced understanding.  

## 2023-01-04 NOTE — Patient Instructions (Signed)
1) Phentermine 2) Follow up prn

## 2023-01-04 NOTE — Progress Notes (Signed)
Established Patient Office Visit  Subjective:  Patient ID: Veronica Ochoa, female    DOB: 13-Nov-1990  Age: 32 y.o. MRN: [id number]  Chief Complaint  Patient presents with   Follow-up    1 week follow up    1 week follow up for acute vaginitis, patient reports relief in as little as 3 days taking metronidazole.  Also wants to discuss weight loss.  Has [employer] so she will call to see if any of the weight loss injectables are covered under her plan.  Meanwhile, she would like to start phentermine x 3 months.    No other concerns at this time.   Past Medical History:  Diagnosis Date   Anxiety    Depression     Past Surgical History:  Procedure Laterality Date   CESAREAN SECTION  06/2020   HYDRADENITIS EXCISION Bilateral 03/14/2021   Procedure: EXCISION OF BILATERAL HIDRADENITIS AXILLA  AND RECURRENT ABCESS;  Surgeon: Luretha Murphy, MD;  Location: [facility];  Service: General;  Laterality: Bilateral;   miscarriage      Social History   Socioeconomic History   Marital status: Single    Spouse name: Not on file   Number of children: 1   Years of education: Not on file   Highest education level: Not on file  Occupational History   Not on file  Tobacco Use   Smoking status: Never   Smokeless tobacco: Never  Vaping Use   Vaping Use: Never used  Substance and Sexual Activity   Alcohol use: Not Currently    Comment: occasional   Drug use: No   Sexual activity: Not on file  Other Topics Concern   Not on file  Social History Narrative   Not on file   Social Determinants of Health   Financial Resource Strain: Not on file  Food Insecurity: Not on file  Transportation Needs: Not on file  Physical Activity: Not on file  Stress: Not on file  Social Connections: Not on file  Intimate Partner Violence: Not At Risk (05/07/2019)   Humiliation, Afraid, Rape, and Kick questionnaire    Fear of Current or Ex-Partner: No    Emotionally Abused: No    Physically Abused: No     Sexually Abused: No    Family History  Problem Relation Age of Onset   Diabetes Father    Arthritis Maternal Grandmother    Diabetes Maternal Grandmother    Asthma Maternal Grandmother     Allergies  Allergen Reactions   Vancomycin Hives and Swelling   Latex Swelling   Morphine And Related Hives    Review of Systems  Constitutional: Negative.   HENT: Negative.    Eyes: Negative.   Respiratory: Negative.    Cardiovascular: Negative.   Gastrointestinal: Negative.   Genitourinary: Negative.   Musculoskeletal: Negative.   Skin: Negative.   Neurological: Negative.   Endo/Heme/Allergies: Negative.   Psychiatric/Behavioral: Negative.         Objective:   Ht 5\' 8"  (1.727 m)   Wt 296 lb (134.3 kg)   BMI 45.01 kg/m   Vitals:   01/04/23 1515  Height: 5\' 8"  (1.727 m)  Weight: 296 lb (134.3 kg)  BMI (Calculated): 45.02    Physical Exam Vitals reviewed.  Constitutional:      Appearance: Normal appearance.  HENT:     Head: Normocephalic.     Nose: Nose normal.     Mouth/Throat:     Mouth: Mucous membranes are moist.  Eyes:  Pupils: Pupils are equal, round, and reactive to light.  Cardiovascular:     Rate and Rhythm: Normal rate and regular rhythm.  Pulmonary:     Effort: Pulmonary effort is normal.     Breath sounds: Normal breath sounds.  Abdominal:     General: Bowel sounds are normal.     Palpations: Abdomen is soft.  Musculoskeletal:        General: Normal range of motion.     Cervical back: Normal range of motion and neck supple.  Skin:    General: Skin is warm and dry.  Neurological:     Mental Status: She is alert and oriented to person, place, and time.  Psychiatric:        Mood and Affect: Mood normal.        Behavior: Behavior normal.      No results found for any visits on 01/04/23.  No results found for this or any previous visit (from the past 2160 hour(s)).    Assessment & Plan:   Problem List Items Addressed This Visit    None   No follow-ups on file.   Total time spent: 15 minutes  Orson Eva, NP  01/04/2023

## 2023-01-04 NOTE — Telephone Encounter (Signed)
-----   Message from Sandi Mealy, MD sent at 01/04/2023  1:55 PM EDT ----- 1. Skin , right neck SEBORRHEIC KERATOSIS, IRRITATED This is a benign growth or "wisdom spot". No additional treatment is needed.    2. 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 It is not dangerous but is contagious and can spread to other areas of skin or other people if it is not completely gone. No additional treatment is needed. However, if it comes back, we can treat it in the clinic or you can also treat it at home with an over the counter salicylic wart treatment (slower).   MAs please call. Thank you!

## 2023-02-05 ENCOUNTER — Telehealth: Payer: Self-pay | Admitting: Nurse Practitioner

## 2023-02-05 NOTE — Telephone Encounter (Signed)
Patient called in stating she had BV recently and was prescribed metronidazole. Chelsa told her if she had these symptoms again to call and she would send the Rx. Since Chelsa is out of the office today can you send please?  [facility]

## 2023-02-16 ENCOUNTER — Other Ambulatory Visit: Payer: Medicaid Other

## 2023-02-16 DIAGNOSIS — I10 Essential (primary) hypertension: Secondary | ICD-10-CM

## 2023-02-16 DIAGNOSIS — E785 Hyperlipidemia, unspecified: Secondary | ICD-10-CM

## 2023-02-16 DIAGNOSIS — E669 Obesity, unspecified: Secondary | ICD-10-CM

## 2023-02-16 DIAGNOSIS — R7301 Impaired fasting glucose: Secondary | ICD-10-CM

## 2023-02-16 DIAGNOSIS — E663 Overweight: Secondary | ICD-10-CM

## 2023-02-17 LAB — CBC WITH DIFFERENTIAL
Basophils Absolute: 0 10*3/uL (ref 0.0–0.2)
Basos: 0 %
EOS (ABSOLUTE): 0.1 10*3/uL (ref 0.0–0.4)
Eos: 1 %
Hematocrit: 36.6 % (ref 34.0–46.6)
Hemoglobin: 11.3 g/dL (ref 11.1–15.9)
Immature Grans (Abs): 0 10*3/uL (ref 0.0–0.1)
Immature Granulocytes: 0 %
Lymphocytes Absolute: 2.2 10*3/uL (ref 0.7–3.1)
Lymphs: 24 %
MCH: 25.9 pg — ABNORMAL LOW (ref 26.6–33.0)
MCHC: 30.9 g/dL — ABNORMAL LOW (ref 31.5–35.7)
MCV: 84 fL (ref 79–97)
Monocytes Absolute: 0.6 10*3/uL (ref 0.1–0.9)
Monocytes: 6 %
Neutrophils Absolute: 6.1 10*3/uL (ref 1.4–7.0)
Neutrophils: 69 %
RBC: 4.36 x10E6/uL (ref 3.77–5.28)
RDW: 13.4 % (ref 11.7–15.4)
WBC: 8.9 10*3/uL (ref 3.4–10.8)

## 2023-02-17 LAB — LIPID PANEL
Chol/HDL Ratio: 2.4 ratio (ref 0.0–4.4)
Cholesterol, Total: 153 mg/dL (ref 100–199)
HDL: 65 mg/dL (ref >39)
LDL Chol Calc (NIH): 78 mg/dL (ref 0–99)
Triglycerides: 48 mg/dL (ref 0–149)
VLDL Cholesterol Cal: 10 mg/dL (ref 5–40)

## 2023-02-17 LAB — CMP14+EGFR
ALT: 21 IU/L (ref 0–32)
AST: 11 IU/L (ref 0–40)
Albumin: 4 g/dL (ref 4.0–5.0)
Alkaline Phosphatase: 72 IU/L (ref 44–121)
BUN/Creatinine Ratio: 14 (ref 9–23)
BUN: 11 mg/dL (ref 6–20)
Bilirubin Total: 0.5 mg/dL (ref 0.0–1.2)
CO2: 22 mmol/L (ref 20–29)
Calcium: 9.6 mg/dL (ref 8.7–10.2)
Chloride: 104 mmol/L (ref 96–106)
Creatinine, Ser: 0.78 mg/dL (ref 0.57–1.00)
Globulin, Total: 3 g/dL (ref 1.5–4.5)
Glucose: 90 mg/dL (ref 70–99)
Potassium: 4.3 mmol/L (ref 3.5–5.2)
Sodium: 140 mmol/L (ref 134–144)
Total Protein: 7 g/dL (ref 6.0–8.5)
eGFR: 105 mL/min/{1.73_m2} (ref >59)

## 2023-02-17 LAB — HEMOGLOBIN A1C
Est. average glucose Bld gHb Est-mCnc: 123 mg/dL
Hgb A1c MFr Bld: 5.9 % — ABNORMAL HIGH (ref 4.8–5.6)

## 2023-02-17 LAB — TSH: TSH: 1.31 u[IU]/mL (ref 0.450–4.500)

## 2023-02-19 ENCOUNTER — Encounter: Payer: Self-pay | Admitting: Internal Medicine

## 2023-02-19 ENCOUNTER — Ambulatory Visit: Payer: Medicaid Other | Admitting: Internal Medicine

## 2023-02-19 VITALS — BP 122/80 | HR 80 | Ht 68.0 in | Wt 292.8 lb

## 2023-02-19 DIAGNOSIS — Z202 Contact with and (suspected) exposure to infections with a predominantly sexual mode of transmission: Secondary | ICD-10-CM | POA: Diagnosis not present

## 2023-02-19 DIAGNOSIS — E663 Overweight: Secondary | ICD-10-CM

## 2023-02-19 DIAGNOSIS — B009 Herpesviral infection, unspecified: Secondary | ICD-10-CM | POA: Diagnosis not present

## 2023-02-19 DIAGNOSIS — N760 Acute vaginitis: Secondary | ICD-10-CM | POA: Diagnosis not present

## 2023-02-19 DIAGNOSIS — Z0000 Encounter for general adult medical examination without abnormal findings: Secondary | ICD-10-CM

## 2023-02-19 DIAGNOSIS — Z124 Encounter for screening for malignant neoplasm of cervix: Secondary | ICD-10-CM

## 2023-02-19 LAB — POCT URINALYSIS DIPSTICK
Bilirubin, UA: NEGATIVE
Blood, UA: NEGATIVE
Glucose, UA: NEGATIVE
Ketones, UA: NEGATIVE
Leukocytes, UA: NEGATIVE
Nitrite, UA: NEGATIVE
Protein, UA: NEGATIVE
Spec Grav, UA: 1.015 (ref 1.010–1.025)
Urobilinogen, UA: 0.2 E.U./dL
pH, UA: 6 (ref 5.0–8.0)

## 2023-02-19 MED ORDER — VALACYCLOVIR HCL 500 MG PO TABS: 500.0000 mg | ORAL_TABLET | Freq: Two times a day (BID) | ORAL | 6 refills | Status: AC

## 2023-02-19 MED ORDER — VALACYCLOVIR HCL 500 MG PO TABS: 500.0000 mg | ORAL_TABLET | Freq: Two times a day (BID) | ORAL | 6 refills | Status: DC

## 2023-02-19 NOTE — Progress Notes (Signed)
Established Patient Office Visit  Subjective:  Patient ID: Veronica Ochoa, female    DOB: 1991-07-27  Age: 32 y.o. MRN: [id number]  Chief Complaint  Patient presents with   Annual Exam    Annual exam and pap    Patient comes in for CPE with PAP. She has been taking Phentermine regularly, and lost weight since last visit. However its affecting her sleep a little. Patient is due for PAP , but also wants to checked for STI's. 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 Recently treated for Bacterial Vaginosis. Currently on Nexplanon- gets regular periods. No other complaints.    No other concerns at this time.   Past Medical History:  Diagnosis Date   Anxiety    Depression     Past Surgical History:  Procedure Laterality Date   CESAREAN SECTION  06/2020   HYDRADENITIS EXCISION Bilateral 03/14/2021   Procedure: EXCISION OF BILATERAL HIDRADENITIS AXILLA  AND RECURRENT ABCESS;  Surgeon: Luretha Murphy, MD;  Location: [facility];  Service: General;  Laterality: Bilateral;   miscarriage      Social History   Socioeconomic History   Marital status: Single    Spouse name: Not on file   Number of children: 1   Years of education: Not on file   Highest education level: Not on file  Occupational History   Not on file  Tobacco Use   Smoking status: Never   Smokeless tobacco: Never  Vaping Use   Vaping Use: Never used  Substance and Sexual Activity   Alcohol use: Not Currently    Comment: occasional   Drug use: No   Sexual activity: Not on file  Other Topics Concern   Not on file  Social History Narrative   Not on file   Social Determinants of Health   Financial Resource Strain: Not on file  Food Insecurity: Not on file  Transportation Needs: Not on file  Physical Activity: Not on file  Stress: Not on file  Social Connections: Not on file  Intimate Partner Violence: Not At Risk (05/07/2019)   Humiliation, Afraid, Rape, and  Kick questionnaire    Fear of Current or Ex-Partner: No    Emotionally Abused: No    Physically Abused: No    Sexually Abused: No    Family History  Problem Relation Age of Onset   Diabetes Father    Arthritis Maternal Grandmother    Diabetes Maternal Grandmother    Asthma Maternal Grandmother     Allergies  Allergen Reactions   Vancomycin Hives and Swelling   Latex Swelling   Morphine And Codeine Hives    Review of Systems  Constitutional:  Negative for chills, diaphoresis, malaise/fatigue and weight loss.  HENT: Negative.    Eyes: Negative.   Respiratory:  Negative for cough, sputum production, shortness of breath and wheezing.   Cardiovascular:  Negative for chest pain, palpitations, leg swelling and PND.  Gastrointestinal:  Negative for abdominal pain, blood in stool, constipation, heartburn, nausea and vomiting.  Genitourinary:  Negative for dysuria, flank pain, frequency and urgency.  Musculoskeletal:  Negative for back pain, falls, joint pain and myalgias.  Skin: Negative.   Neurological:  Negative for dizziness, tingling, speech change, weakness and headaches.  Psychiatric/Behavioral:  Negative for depression and memory loss. The patient is not nervous/anxious.        Objective:   BP 122/80   Pulse 80   Ht 5'  8" (1.727 m)   Wt 292 lb 12.8 oz (132.8 kg)   SpO2 100%   BMI 44.52 kg/m   Vitals:   02/19/23 1341  BP: 122/80  Pulse: 80  Height: 5\' 8"  (1.727 m)  Weight: 292 lb 12.8 oz (132.8 kg)  SpO2: 100%  BMI (Calculated): 44.53    Physical Exam Vitals and nursing note reviewed. Exam conducted with a chaperone present.  Constitutional:      General: She is not in acute distress.    Appearance: She is obese.  HENT:     Head: Normocephalic and atraumatic.     Nose: Nose normal.     Mouth/Throat:     Pharynx: Oropharynx is clear. No oropharyngeal exudate.  Eyes:     Conjunctiva/sclera: Conjunctivae normal.  Cardiovascular:     Rate and Rhythm:  Normal rate and regular rhythm.     Pulses: Normal pulses.     Heart sounds: Normal heart sounds. No murmur heard. Pulmonary:     Effort: Pulmonary effort is normal.     Breath sounds: No wheezing, rhonchi or rales.  Chest:  Breasts:    Right: Normal. No swelling, bleeding, inverted nipple, mass, nipple discharge, skin change or tenderness.     Left: Normal. No swelling, inverted nipple, mass, nipple discharge, skin change or tenderness.  Abdominal:     General: Bowel sounds are normal. There is no distension.     Palpations: Abdomen is soft.     Tenderness: There is no abdominal tenderness. There is no right CVA tenderness, left CVA tenderness or guarding.     Hernia: No hernia is present. There is no hernia in the left inguinal area or right inguinal area.  Genitourinary:    Pubic Area: No rash.      Labia:        Right: No rash, tenderness, lesion or injury.        Left: No rash, tenderness, lesion or injury.      Urethra: No prolapse or urethral swelling.     Vagina: Normal. No signs of injury and foreign body. No vaginal discharge, tenderness, bleeding, lesions or prolapsed vaginal walls.     Cervix: Normal.     Uterus: Normal.      Adnexa: Right adnexa normal and left adnexa normal.       Right: No mass.    Musculoskeletal:     Right lower leg: No edema.     Left lower leg: No edema.  Lymphadenopathy:     Upper Body:     Right upper body: No supraclavicular, axillary or pectoral adenopathy.     Left upper body: No supraclavicular, axillary or pectoral adenopathy.     Lower Body: No right inguinal adenopathy.  Neurological:     General: No focal deficit present.     Mental Status: She is alert.     Gait: Gait normal.  Psychiatric:        Mood and Affect: Mood normal.        Behavior: Behavior normal.      Results for orders placed or performed in visit on 02/19/23  POCT Urinalysis Dipstick (81002)  Result Value Ref Range   Color, UA yellow    Clarity, UA clear     Glucose, UA Negative Negative   Bilirubin, UA negative    Ketones, UA negative    Spec Grav, UA 1.015 1.010 - 1.025   Blood, UA negative    pH, UA 6.0 5.0 - 8.0  Protein, UA Negative Negative   Urobilinogen, UA 0.2 0.2 or 1.0 E.U./dL   Nitrite, UA negative    Leukocytes, UA Negative Negative   Appearance clear    Odor none     Recent Results (from the past 2160 hour(s))  Hemoglobin A1c     Status: Abnormal   Collection Time: 02/16/23  9:18 AM  Result Value Ref Range   Hgb A1c MFr Bld 5.9 (H) 4.8 - 5.6 %    Comment:          Prediabetes: 5.7 - 6.4          Diabetes: >6.4          Glycemic control for adults with diabetes: <7.0    Est. average glucose Bld gHb Est-mCnc 123 mg/dL  TSH     Status: None   Collection Time: 02/16/23  9:18 AM  Result Value Ref Range   TSH 1.310 0.450 - 4.500 uIU/mL  CMP14+EGFR     Status: None   Collection Time: 02/16/23  9:18 AM  Result Value Ref Range   Glucose 90 70 - 99 mg/dL   BUN 11 6 - 20 mg/dL   Creatinine, Ser 1.61 0.57 - 1.00 mg/dL   eGFR 096 >04 VW/UJW/1.19   BUN/Creatinine Ratio 14 9 - 23   Sodium 140 134 - 144 mmol/L   Potassium 4.3 3.5 - 5.2 mmol/L   Chloride 104 96 - 106 mmol/L   CO2 22 20 - 29 mmol/L   Calcium 9.6 8.7 - 10.2 mg/dL   Total Protein 7.0 6.0 - 8.5 g/dL   Albumin 4.0 4.0 - 5.0 g/dL   Globulin, Total 3.0 1.5 - 4.5 g/dL   Bilirubin Total 0.5 0.0 - 1.2 mg/dL   Alkaline Phosphatase 72 44 - 121 IU/L   AST 11 0 - 40 IU/L   ALT 21 0 - 32 IU/L  Lipid panel     Status: None   Collection Time: 02/16/23  9:18 AM  Result Value Ref Range   Cholesterol, Total 153 100 - 199 mg/dL   Triglycerides 48 0 - 149 mg/dL   HDL 65 >14 mg/dL   VLDL Cholesterol Cal 10 5 - 40 mg/dL   LDL Chol Calc (NIH) 78 0 - 99 mg/dL   Chol/HDL Ratio 2.4 0.0 - 4.4 ratio    Comment:                                   T. Chol/HDL Ratio                                             Men  Women                               1/2 Avg.Risk  3.4    3.3                                    Avg.Risk  5.0    4.4                                2X Avg.Risk  9.6    7.1                                3X Avg.Risk 23.4   11.0   CBC With Differential     Status: Abnormal   Collection Time: 02/16/23  9:18 AM  Result Value Ref Range   WBC 8.9 3.4 - 10.8 x10E3/uL   RBC 4.36 3.77 - 5.28 x10E6/uL   Hemoglobin 11.3 11.1 - 15.9 g/dL   Hematocrit 16.1 09.6 - 46.6 %   MCV 84 79 - 97 fL   MCH 25.9 (L) 26.6 - 33.0 pg   MCHC 30.9 (L) 31.5 - 35.7 g/dL   RDW 04.5 40.9 - 81.1 %   Neutrophils 69 Not Estab. %   Lymphs 24 Not Estab. %   Monocytes 6 Not Estab. %   Eos 1 Not Estab. %   Basos 0 Not Estab. %   Neutrophils Absolute 6.1 1.4 - 7.0 x10E3/uL   Lymphocytes Absolute 2.2 0.7 - 3.1 x10E3/uL   Monocytes Absolute 0.6 0.1 - 0.9 x10E3/uL   EOS (ABSOLUTE) 0.1 0.0 - 0.4 x10E3/uL   Basophils Absolute 0.0 0.0 - 0.2 x10E3/uL   Immature Granulocytes 0 Not Estab. %   Immature Grans (Abs) 0.0 0.0 - 0.1 x10E3/uL    Comment: **Effective March 26, 2023, profile [id number] CBC/Differential**   (No Platelet) will be made non-orderable. [facility] Offers:   [id number] CBC With Differential/Platelet   POCT Urinalysis Dipstick ([id number])     Status: Normal   Collection Time: 02/19/23  2:29 PM  Result Value Ref Range   Color, UA yellow    Clarity, UA clear    Glucose, UA Negative Negative   Bilirubin, UA negative    Ketones, UA negative    Spec Grav, UA 1.015 1.010 - 1.025   Blood, UA negative    pH, UA 6.0 5.0 - 8.0   Protein, UA Negative Negative   Urobilinogen, UA 0.2 0.2 or 1.0 E.U./dL   Nitrite, UA negative    Leukocytes, UA Negative Negative   Appearance clear    Odor none       Assessment & Plan:   Patient will continue diet, exercise and Adipex P. Will discuss results once available. Problem List Items Addressed This Visit     Obesity, unspecified   HSV-2 infection - Primary   Relevant Medications   valACYclovir (VALTREX) 500 MG tablet   Acute vaginitis    Relevant Orders   POCT Urinalysis Dipstick (30865) (Completed)   Exposure to sexually transmitted disease (STD)   Relevant Orders   HepB+HepC+HIV Panel   RPR   HSV 1 and 2 Ab, IgG   IGP,CtNgTv,Apt HPV,rfx16/18,45   HIV antibody (with reflex)   Other Visit Diagnoses     Screening for cervical cancer       Annual physical exam           Follow up 6 weeks .   Total time spent: 30 minutes  Margaretann Loveless, MD  02/19/2023   This document may have been prepared by [facility] Voice Recognition software and as such may include unintentional dictation errors.

## 2023-02-20 NOTE — Telephone Encounter (Signed)
[facility] and confirmed Rx was received. Spoke with patient and informed her that pharmacy is filling Rx and if she had any questions to contact pharmacy.

## 2023-02-22 LAB — IGP,CTNGTV,APT HPV,RFX16/18,45
Chlamydia, Nuc. Acid Amp: NEGATIVE
Gonococcus, Nuc. Acid Amp: NEGATIVE
HPV Aptima: NEGATIVE
PAP Smear Comment: 0
Trich vag by NAA: NEGATIVE

## 2023-02-22 LAB — SPECIMEN STATUS REPORT

## 2023-02-26 NOTE — Progress Notes (Signed)
Spoke with patient, Who verbalized understanding.

## 2023-02-28 NOTE — Progress Notes (Signed)
Spoke with patient verbalized understanding.

## 2023-02-28 NOTE — Progress Notes (Signed)
LMTRC

## 2023-03-06 ENCOUNTER — Other Ambulatory Visit: Payer: Self-pay

## 2023-03-06 MED ORDER — METRONIDAZOLE 500 MG PO TABS: 500.0000 mg | ORAL_TABLET | Freq: Two times a day (BID) | ORAL | 1 refills | Status: AC

## 2023-03-14 ENCOUNTER — Encounter: Payer: Self-pay | Admitting: Nurse Practitioner

## 2023-04-02 ENCOUNTER — Ambulatory Visit: Payer: Medicaid Other | Admitting: Nurse Practitioner

## 2023-04-03 ENCOUNTER — Other Ambulatory Visit: Payer: Medicaid Other

## 2023-04-03 ENCOUNTER — Ambulatory Visit: Payer: Medicaid Other

## 2023-04-12 ENCOUNTER — Encounter: Payer: Self-pay | Admitting: Dermatology

## 2023-04-12 ENCOUNTER — Ambulatory Visit: Payer: Medicaid Other | Admitting: Dermatology

## 2023-04-12 DIAGNOSIS — L700 Acne vulgaris: Secondary | ICD-10-CM

## 2023-04-12 DIAGNOSIS — L732 Hidradenitis suppurativa: Secondary | ICD-10-CM | POA: Diagnosis not present

## 2023-04-12 DIAGNOSIS — Z79899 Other long term (current) drug therapy: Secondary | ICD-10-CM | POA: Diagnosis not present

## 2023-04-12 MED ORDER — DOXYCYCLINE MONOHYDRATE 100 MG PO CAPS
100.0000 mg | ORAL_CAPSULE | Freq: Two times a day (BID) | ORAL | 2 refills | Status: DC
Start: 2023-04-12 — End: 2023-09-14

## 2023-04-12 MED ORDER — SPIRONOLACTONE 50 MG PO TABS
ORAL_TABLET | ORAL | 2 refills | Status: DC
Start: 2023-04-12 — End: 2023-09-14

## 2023-04-12 MED ORDER — TRETINOIN 0.025 % EX CREA
TOPICAL_CREAM | Freq: Every day | CUTANEOUS | 2 refills | Status: DC
Start: 2023-04-12 — End: 2023-09-14

## 2023-04-12 NOTE — Progress Notes (Signed)
Follow Up Visit   Subjective  Veronica Ochoa is a 32 y.o. female who presents for the following: bumps and acne.  3 month HS follow up. Patient has been using clindamycin at left axilla and left thigh, spots came up mid July and lasted a few weeks. No drainage. One of the spots at inner thigh had a scar and scabbed up. Improved. Also using chlorhexidine as needed. Patient deferred oral treatment at last visit.   Patient had surgery in July 2022 for excision of bilateral hidradenitis axilla.   Patient with acne at face, not using any prescriptions to treat. Worse with periods. She has not used any rx medications.   The following portions of the chart were reviewed this encounter and updated as appropriate: medications, allergies, medical history  Review of Systems:  No other skin or systemic complaints except as noted in HPI or Assessment and Plan.  Objective  Well appearing patient in no apparent distress; mood and affect are within normal limits.  A focused examination was performed of the following areas:  Axillae, left medial thigh, face  Relevant exam findings are noted in the Assessment and Plan.    Assessment & Plan     HIDRADENITIS SUPPURATIVA, chronic, flaring, not at patient goal Exam: L axilla with scarred nondraining sinus tract. Hyperpigmented follicular inflamed papules on left medial thigh. Bilateral surgical scars from prior excision of lesions  Hidradenitis Suppurativa is a chronic; persistent; non-curable, but treatable condition due to abnormal inflamed sweat glands in the body folds (axilla, inframammary, groin, medial thighs), causing recurrent painful draining cysts and scarring. It can be associated with severe scarring acne and cysts; also abscesses and scarring of scalp. The goal is control and prevention of flares, as it is not curable. Scars are permanent and can be thickened. Treatment may include daily use of topical medication and oral antibiotics.   Oral isotretinoin may also be helpful.  For some cases, Humira or Cosentyx (biologic injections) may be prescribed to decrease the inflammatory process and prevent flares.  When indicated, inflamed cysts may also be treated surgically.  Treatment Plan: Continue chlorhexidine daily Start doxycycline 100 mg twice daily with food.  Start spironolactone 50 mg x 2 weeks then can increase to 100 mg once daily.   Long term medication management.  Patient is using long term (months to years) prescription medication  to control their dermatologic condition.  These medications require periodic monitoring to evaluate for efficacy and side effects and may require periodic laboratory monitoring.   ACNE VULGARIS, chronic, flaring, not at patient goal Exam: Open and closed comedones forehead and cheeks, few pustules at forehead, scattered PIH   Treatment Plan: Comedonal and inflammatory acne Start tretinoin 0.025% cream every other night x 2 weeks then increase to nightly as tolerated.  Start doxycycline as above  Start spironolactone as above.   Topical retinoid medications like tretinoin/Retin-A, adapalene/Differin, tazarotene/Fabior, and Epiduo/Epiduo Forte can cause dryness and irritation when first started. Only apply a pea-sized amount to the entire affected area. Avoid applying it around the eyes, edges of mouth and creases at the nose. If you experience irritation, use a good moisturizer first and/or apply the medicine less often. If you are doing well with the medicine, you can increase how often you use it until you are applying every night. Be careful with sun protection while using this medication as it can make you sensitive to the sun. This medicine should not be used by pregnant women.  Doxycycline should be taken with food to prevent nausea. Do not lay down for 30 minutes after taking. Be cautious with sun exposure and use good sun protection while on this medication. Pregnant women should not  take this medication.   Spironolactone can cause increased urination and cause blood pressure to decrease. Please watch for signs of lightheadedness and be cautious when changing position. It can sometimes cause breast tenderness or an irregular period in premenopausal women. It can also increase potassium. The increase in potassium usually is not a concern unless you are taking other medicines that also increase potassium, so please be sure your doctor knows all of the other medications you are taking. This medication should not be taken by pregnant women.  This medicine should also not be taken together with sulfa drugs like Bactrim (trimethoprim/sulfamethexazole).    Patient planning to have weight loss surgery next month but would like to start treatment for her acne and HS now.    Return in about 3 months (around 07/13/2023) for acne, Hidradenitis.  Anise Salvo, RMA, am acting as scribe for Elie Goody, MD .   Documentation: I have reviewed the above documentation for accuracy and completeness, and I agree with the above.  Elie Goody, MD

## 2023-04-12 NOTE — Patient Instructions (Signed)
Treatment Plan: Start tretinoin 0.025% cream every other night x 2 weeks then increase to nightly as tolerated.  Start doxycycline 100 mg twice daily with food.  Start spironolactone 50 mg x 2 weeks then can increase to 100 mg once daily.  Continue chlorhexidine daily in the shower to affected areas.   Topical retinoid medications like tretinoin/Retin-A, adapalene/Differin, tazarotene/Fabior, and Epiduo/Epiduo Forte can cause dryness and irritation when first started. Only apply a pea-sized amount to the entire affected area. Avoid applying it around the eyes, edges of mouth and creases at the nose. If you experience irritation, use a good moisturizer first and/or apply the medicine less often. If you are doing well with the medicine, you can increase how often you use it until you are applying every night. Be careful with sun protection while using this medication as it can make you sensitive to the sun. This medicine should not be used by pregnant women.   Doxycycline should be taken with food to prevent nausea. Do not lay down for 30 minutes after taking. Be cautious with sun exposure and use good sun protection while on this medication. Pregnant women should not take this medication.   Spironolactone can cause increased urination and cause blood pressure to decrease. Please watch for signs of lightheadedness and be cautious when changing position. It can sometimes cause breast tenderness or an irregular period in premenopausal women. It can also increase potassium. The increase in potassium usually is not a concern unless you are taking other medicines that also increase potassium, so please be sure your doctor knows all of the other medications you are taking. This medication should not be taken by pregnant women.  This medicine should also not be taken together with sulfa drugs like Bactrim (trimethoprim/sulfamethexazole).   Due to recent changes in healthcare laws, you may see results of your  pathology and/or laboratory studies on MyChart before the doctors have had a chance to review them. We understand that in some cases there may be results that are confusing or concerning to you. Please understand that not all results are received at the same time and often the doctors may need to interpret multiple results in order to provide you with the best plan of care or course of treatment. Therefore, we ask that you please give [place] 2 business days to thoroughly review all your results before contacting the office for clarification. Should we see a critical lab result, you will be contacted sooner.   If You Need Anything After Your Visit  If you have any questions or concerns for your doctor, please call our main line at [phone] and press option 4 to reach your doctor's medical assistant. If no one answers, please leave a voicemail as directed and we will return your call as soon as possible. Messages left after 4 pm will be answered the following business day.   You may also send [place] a message via MyChart. We typically respond to MyChart messages within 1-2 business days.  For prescription refills, please ask your pharmacy to contact our office. Our fax number is [phone].  If you have an urgent issue when the clinic is closed that cannot wait until the next business day, you can page your doctor at the number below.    Please note that while we do our best to be available for urgent issues outside of office hours, we are not available 24/7.   If you have an urgent issue and are unable to reach [place],  you may choose to seek medical care at your doctor's office, retail clinic, urgent care center, or emergency room.  If you have a medical emergency, please immediately call 911 or go to the emergency department.  Pager Numbers  - Dr. Gwen Pounds: [phone]  - Dr. Roseanne Reno: [phone]  - Dr. Katrinka Blazing: [phone]   In the event of inclement weather, please call our main line at  [phone] for an update on the status of any delays or closures.  Dermatology Medication Tips: Please keep the boxes that topical medications come in in order to help keep track of the instructions about where and how to use these. Pharmacies typically print the medication instructions only on the boxes and not directly on the medication tubes.   If your medication is too expensive, please contact our office at [phone] option 4 or send [place] a message through MyChart.   We are unable to tell what your co-pay for medications will be in advance as this is different depending on your insurance coverage. However, we may be able to find a substitute medication at lower cost or fill out paperwork to get insurance to cover a needed medication.   If a prior authorization is required to get your medication covered by your insurance company, please allow [place] 1-2 business days to complete this process.  Drug prices often vary depending on where the prescription is filled and some pharmacies may offer cheaper prices.  The website www.goodrx.com contains coupons for medications through different pharmacies. The prices here do not account for what the cost may be with help from insurance (it may be cheaper with your insurance), but the website can give you the price if you did not use any insurance.  - You can print the associated coupon and take it with your prescription to the pharmacy.  - You may also stop by our office during regular business hours and pick up a GoodRx coupon card.  - If you need your prescription sent electronically to a different pharmacy, notify our office through [facility] or by phone at [phone] option 4.

## 2023-04-14 ENCOUNTER — Encounter: Payer: Self-pay | Admitting: Dermatology

## 2023-04-16 ENCOUNTER — Other Ambulatory Visit: Payer: Self-pay

## 2023-04-16 ENCOUNTER — Ambulatory Visit (LOCAL_COMMUNITY_HEALTH_CENTER): Payer: Self-pay

## 2023-04-16 DIAGNOSIS — Z111 Encounter for screening for respiratory tuberculosis: Secondary | ICD-10-CM

## 2023-04-19 ENCOUNTER — Ambulatory Visit (LOCAL_COMMUNITY_HEALTH_CENTER): Payer: Medicaid Other

## 2023-04-19 DIAGNOSIS — Z111 Encounter for screening for respiratory tuberculosis: Secondary | ICD-10-CM

## 2023-04-19 LAB — TB SKIN TEST
Induration: 0 mm
TB Skin Test: NEGATIVE

## 2023-07-16 ENCOUNTER — Ambulatory Visit: Payer: Medicaid Other | Admitting: Dermatology

## 2023-09-14 ENCOUNTER — Ambulatory Visit: Payer: Medicaid Other | Admitting: Nurse Practitioner

## 2023-09-14 ENCOUNTER — Ambulatory Visit: Payer: Medicaid Other | Admitting: Cardiology

## 2023-09-14 ENCOUNTER — Encounter: Payer: Self-pay | Admitting: Cardiology

## 2023-09-14 VITALS — BP 122/69 | HR 83 | Ht 67.5 in | Wt 268.0 lb

## 2023-09-14 VITALS — BP 112/71 | HR 85 | Ht 67.0 in | Wt 264.9 lb

## 2023-09-14 DIAGNOSIS — Z309 Encounter for contraceptive management, unspecified: Secondary | ICD-10-CM | POA: Diagnosis not present

## 2023-09-14 DIAGNOSIS — E66813 Obesity, class 3: Secondary | ICD-10-CM | POA: Diagnosis not present

## 2023-09-14 DIAGNOSIS — Z3049 Encounter for surveillance of other contraceptives: Secondary | ICD-10-CM | POA: Diagnosis not present

## 2023-09-14 DIAGNOSIS — Z0130 Encounter for examination of blood pressure without abnormal findings: Secondary | ICD-10-CM

## 2023-09-14 DIAGNOSIS — Z3046 Encounter for surveillance of implantable subdermal contraceptive: Secondary | ICD-10-CM

## 2023-09-14 NOTE — Progress Notes (Signed)
 Pt is here for nexplanon removal, condoms declined. Gaspar Garbe, RN

## 2023-09-14 NOTE — Progress Notes (Signed)
Established Patient Office Visit  Subjective:  Patient ID: Veronica Ochoa, female    DOB: [birth year]  Age: 33 y.o. MRN: [id number]  No chief complaint on file.   Patient in office needing cardiac clearance. Patient is being worked up for bariatric surgery at [facility]. Surgery date not yet set, patient anticipating the end of February. Patient doing well. Patient has no complaints today. Patient has lost 28 lbs since previous visit through diet and exercise. Patient not currently taking any medications. EKG done at [facility] 05/03/2023 was NSR. Patient may proceed with surgery.    No other concerns at this time.   Past Medical History:  Diagnosis Date   Anxiety    Depression     Past Surgical History:  Procedure Laterality Date   CESAREAN SECTION  06/2020   HYDRADENITIS EXCISION Bilateral 03/14/2021   Procedure: EXCISION OF BILATERAL HIDRADENITIS AXILLA  AND RECURRENT ABCESS;  Surgeon: Luretha Murphy, MD;  Location: [facility];  Service: General;  Laterality: Bilateral;   miscarriage      Social History   Socioeconomic History   Marital status: Single    Spouse name: Not on file   Number of children: 1   Years of education: Not on file   Highest education level: Not on file  Occupational History   Not on file  Tobacco Use   Smoking status: Never   Smokeless tobacco: Never  Vaping Use   Vaping status: Never Used  Substance and Sexual Activity   Alcohol use: Not Currently    Comment: occasional   Drug use: No   Sexual activity: Not on file  Other Topics Concern   Not on file  Social History Narrative   Not on file   Social Drivers of Health   Financial Resource Strain: Low Risk  (03/14/2023)   Received from [facility]   Overall Financial Resource Strain (CARDIA)    Difficulty of Paying Living Expenses: Not hard at all  Food Insecurity: No Food Insecurity (03/14/2023)   Received from [facility]   Hunger Vital Sign    Worried About Running Out of Food in  the Last Year: Never true    Ran Out of Food in the Last Year: Never true  Transportation Needs: No Transportation Needs (03/14/2023)   Received from [facility] - Transportation    Lack of Transportation (Medical): No    Lack of Transportation (Non-Medical): No  Physical Activity: Sufficiently Active (03/14/2023)   Received from [facility]   Exercise Vital Sign    Days of Exercise per Week: 5 days    Minutes of Exercise per Session: 30 min  Stress: Stress Concern Present (03/14/2023)   Received from [facility] - Occupational Stress Questionnaire    Feeling of Stress : To some extent  Social Connections: Socially Isolated (03/14/2023)   Received from [facility]   Social Network    How would you rate your social network (family, work, friends)?: Little participation, lonely and socially isolated  Intimate Partner Violence: Not At Risk (03/14/2023)   Received from [facility]   HITS    Over the last 12 months how often did your partner physically hurt you?: Never    Over the last 12 months how often did your partner insult you or talk down to you?: Never    Over the last 12 months how often did your partner threaten you with physical harm?: Rarely  Over the last 12 months how often did your partner scream or curse at you?: Sometimes    Family History  Problem Relation Age of Onset   Diabetes Father    Arthritis Maternal Grandmother    Diabetes Maternal Grandmother    Asthma Maternal Grandmother     Allergies  Allergen Reactions   Vancomycin Hives and Swelling   Latex Swelling   Morphine And Codeine Hives    Outpatient Medications Prior to Visit  Medication Sig   [DISCONTINUED] clindamycin (CLEOCIN) 300 MG capsule Take by mouth. (Patient not taking: Reported on 09/14/2023)   [DISCONTINUED] doxycycline (MONODOX) 100 MG capsule Take 1 capsule (100 mg total) by mouth 2 (two) times daily. Take with food (Patient not  taking: Reported on 09/14/2023)   [DISCONTINUED] etonogestrel (NEXPLANON) 68 MG IMPL implant 1 each by Subdermal route once. (Patient not taking: Reported on 09/14/2023)   [DISCONTINUED] phentermine (ADIPEX-P) 37.5 MG tablet Take 1 tablet (37.5 mg total) by mouth daily before breakfast. (Patient not taking: Reported on 09/14/2023)   [DISCONTINUED] spironolactone (ALDACTONE) 50 MG tablet Take 50 mg once daily x 2 weeks, if tolerating may increase to 100 mg once daily. (Patient not taking: Reported on 09/14/2023)   [DISCONTINUED] tretinoin (RETIN-A) 0.025 % cream Apply topically at bedtime. (Patient not taking: Reported on 09/14/2023)   No facility-administered medications prior to visit.    Review of Systems  Constitutional: Negative.   HENT: Negative.    Eyes: Negative.   Respiratory: Negative.  Negative for shortness of breath.   Cardiovascular: Negative.  Negative for chest pain.  Gastrointestinal: Negative.  Negative for abdominal pain, constipation and diarrhea.  Genitourinary: Negative.   Musculoskeletal:  Negative for joint pain and myalgias.  Skin: Negative.   Neurological: Negative.  Negative for dizziness and headaches.  Endo/Heme/Allergies: Negative.   All other systems reviewed and are negative.      Objective:   BP 112/71   Pulse 85   Ht 5\' 7"  (1.702 m)   Wt 264 lb 14.4 oz (120.2 kg)   SpO2 98%   BMI 41.49 kg/m   Vitals:   09/14/23 1021  BP: 112/71  Pulse: 85  Height: 5\' 7"  (1.702 m)  Weight: 264 lb 14.4 oz (120.2 kg)  SpO2: 98%  BMI (Calculated): 41.48    Physical Exam Vitals and nursing note reviewed.  Constitutional:      Appearance: Normal appearance. She is normal weight.  HENT:     Head: Normocephalic and atraumatic.     Nose: Nose normal.     Mouth/Throat:     Mouth: Mucous membranes are moist.  Eyes:     Extraocular Movements: Extraocular movements intact.     Conjunctiva/sclera: Conjunctivae normal.     Pupils: Pupils are equal, round, and  reactive to light.  Cardiovascular:     Rate and Rhythm: Normal rate and regular rhythm.     Pulses: Normal pulses.     Heart sounds: Normal heart sounds.  Pulmonary:     Effort: Pulmonary effort is normal.     Breath sounds: Normal breath sounds.  Abdominal:     General: Abdomen is flat. Bowel sounds are normal.     Palpations: Abdomen is soft.  Musculoskeletal:        General: Normal range of motion.     Cervical back: Normal range of motion.  Skin:    General: Skin is warm and dry.  Neurological:     General: No focal deficit present.  Mental Status: She is alert and oriented to person, place, and time.  Psychiatric:        Mood and Affect: Mood normal.        Behavior: Behavior normal.        Thought Content: Thought content normal.        Judgment: Judgment normal.      No results found for any visits on 09/14/23.  No results found for this or any previous visit (from the past 2160 hours).    Assessment & Plan:  Patient may proceed with bariatric surgery as planned.  Problem List Items Addressed This Visit       Other   Obesity, unspecified - Primary    Return in about 6 months (around 03/13/2024) for with fasting labs prior.   Total time spent: 25 minutes  [employer], NP  09/14/2023   This document may have been prepared by Dragon Voice Recognition software and as such may include unintentional dictation errors.

## 2023-09-16 ENCOUNTER — Encounter: Payer: Self-pay | Admitting: Nurse Practitioner

## 2023-09-16 NOTE — Progress Notes (Signed)
[facility] Problem Visit  [facility]  Subjective:  Veronica Ochoa is a 33 y.o. being seen today for hormonal implant removal.  Chief Complaint  Patient presents with   Annual Exam    Nexplanon removal    Patient has a hormonal implant in her left arm that she desires to have removed today. Patient reports not currently being sexually active. She reports her LMP was 09/04/23. Patient has a history of HSV-2.   Patient is not due for a PE today as she had one completed earlier today with her PCP. Last PAP 02/19/23: NILM, no HPV testing performed d/t age.     Does the patient have a current or past history of drug use? No   No components found for: "HCV"]   Health Maintenance Due  Topic Date Due   Hepatitis C Screening  Never done   DTaP/Tdap/Td (1 - Tdap) Never done   COVID-19 Vaccine (3 - Pfizer risk series) 07/28/2020   INFLUENZA VACCINE  Never done   The following portions of the patient's history were reviewed and updated as appropriate: allergies, current medications, past family history, past medical history, past social history, past surgical history and problem list. Problem list updated.   See flowsheet for other program required questions.  Objective:   Vitals:   09/14/23 1520  BP: 122/69  Pulse: 83  Weight: 268 lb (121.6 kg)  Height: 5' 7.5" (1.715 m)    Physical Exam Nursing note reviewed.  Constitutional:      Appearance: Normal appearance.  HENT:     Mouth/Throat:     Lips: Pink. No lesions.  Eyes:     General:        Right eye: No discharge.        Left eye: No discharge.  Pulmonary:     Effort: Pulmonary effort is normal.  Genitourinary:    Comments: No genital exam performed. Patient asymptomatic and declined exam and STI testing. She had a PE earlier today with PCP.  Skin:    General: Skin is warm and dry.     Comments: Exposed areas only. Skin tone appropriate for ethnicity.   Neurological:     Mental  Status: She is alert and oriented to person, place, and time.  Psychiatric:        Attention and Perception: Attention normal.        Mood and Affect: Mood and affect normal.        Speech: Speech normal.        Behavior: Behavior normal. Behavior is cooperative.        Thought Content: Thought content normal.    Assessment and Plan:  Veronica Ochoa is a 33 y.o. female presenting to the [facility] Department for a Women's Health problem visit  Nexplanon Removal Patient identified, informed consent performed, consent signed.   Appropriate time out taken. Nexplanon site identified in left arm.  Area prepped in usual sterile fashon. 3 ml of 1% lidocaine with Epinephrine was used to anesthetize the area at the distal end of the implant and along implant site. A small stab incision was made right beside the implant on the distal portion.  The Nexplanon rod was grasped using hemostats and removed without difficulty.  There was minimal blood loss. There were no complications.  Steri-strips were applied over the small incision.  A pressure bandage was applied to reduce any bruising.  The patient tolerated the procedure well and was  given post procedure instructions.     Nexplanon:  Counseled patient to take OTC analgesic starting as soon as lidocaine starts to wear off and take regularly for at least 48 hr to decrease discomfort.  Specifically to take with food or milk to decrease stomach upset and for IB 600 mg (3 tablets) every 6 hrs; IB 800 mg (4 tablets) every 8 hrs; or Aleve 2 tablets every 12 hrs.   Discussed with patient that she could now conceive immediately with Nexplanon removed. Patient verbalized understanding. She reports being abstinent at this time and again verbalized she did not want to start contraception at this time.    Return as needed for STI screening, to discuss or initiate contraception, or annual well woman exam.  No future appointments.  Total time with  patient 30 minutes.   Edmonia James, NP

## 2023-10-16 ENCOUNTER — Encounter: Payer: Self-pay | Admitting: Dermatology

## 2023-10-16 ENCOUNTER — Ambulatory Visit: Payer: Medicaid Other | Admitting: Dermatology

## 2023-10-16 DIAGNOSIS — L209 Atopic dermatitis, unspecified: Secondary | ICD-10-CM

## 2023-10-16 DIAGNOSIS — L700 Acne vulgaris: Secondary | ICD-10-CM | POA: Diagnosis not present

## 2023-10-16 DIAGNOSIS — Z7189 Other specified counseling: Secondary | ICD-10-CM

## 2023-10-16 DIAGNOSIS — L732 Hidradenitis suppurativa: Secondary | ICD-10-CM

## 2023-10-16 DIAGNOSIS — Z79899 Other long term (current) drug therapy: Secondary | ICD-10-CM

## 2023-10-16 MED ORDER — SPIRONOLACTONE 50 MG PO TABS: ORAL_TABLET | ORAL | 0 refills | Status: DC

## 2023-10-16 MED ORDER — CLOBETASOL PROPIONATE 0.05 % EX CREA: TOPICAL_CREAM | CUTANEOUS | 1 refills | Status: DC

## 2023-10-16 MED ORDER — TRETINOIN 0.025 % EX CREA: TOPICAL_CREAM | CUTANEOUS | 5 refills | Status: DC

## 2023-10-16 NOTE — Patient Instructions (Signed)

## 2023-10-16 NOTE — Progress Notes (Signed)
Follow Up Visit   Subjective  Veronica Ochoa is a 33 y.o. female who presents for the following: HS follow up, patient was doing well until the weather became cooler. Current active draining lesion on the L axilla, a few months before it became inflamed and ruptured and drained, and three weeks later it happened again. Pt currently using Doxycycline but only PRN flares. She didn't pick up Spironolactone. Acne is also flaring - patient currently using Tretinoin 0.025% cream twice per week since it drys out her skin if she uses it nightly. Pt c/o peeling of the hands, she does wear gloves a lot for school and her job, pt states that gloves are Latex free, she really noticed in January after going back to school. R hand has painful cracks and fissures.  The following portions of the chart were reviewed this encounter and updated as appropriate: medications, allergies, medical history  Review of Systems:  No other skin or systemic complaints except as noted in HPI or Assessment and Plan.  Objective  Well appearing patient in no apparent distress; mood and affect are within normal limits.  A focused examination was performed of the following areas:  The axilla, face, and hands  Relevant exam findings are noted in the Assessment and Plan.    Assessment & Plan   HIDRADENITIS SUPPURATIVA   ACNE VULGARIS   ATOPIC DERMATITIS, UNSPECIFIED TYPE   COUNSELING AND COORDINATION OF CARE    HIDRADENITIS SUPPURATIVA Exam: Active draining inflamed sinus tract of the L axilla  Chronic and persistent condition with duration or expected duration over one year. Condition is bothersome/symptomatic for patient. Currently flared.  Hidradenitis Suppurativa is a chronic; persistent; non-curable, but treatable condition due to abnormal inflamed sweat glands in the body folds (axilla, inframammary, groin, medial thighs), causing recurrent painful draining cysts and scarring. It can be associated with  severe scarring acne and cysts; also abscesses and scarring of scalp. The goal is control and prevention of flares, as it is not curable. Scars are permanent and can be thickened. Treatment may include daily use of topical medication and oral antibiotics.  Oral isotretinoin may also be helpful.  For some cases, Humira or Cosentyx (biologic injections) may be prescribed to decrease the inflammatory process and prevent flares.  When indicated, inflamed cysts may also be treated surgically.  Treatment Plan: Discussed Isotretinoin and potential side effects. Recommend 6-8 mth course. Advised patient that she would need to be off of Isotretinoin before having weight loss surgery. Continue Chlorhexidine wash daily Continue Doxycycline 100 mg twice daily with food PRN flares. Taking medication daily causes her to have yeast infections. Start Spironolactone 50 mg x 2 weeks then can increase to 100 mg once daily.  Axillary botox is for hyperhidrosis and not HS  Long term medication management.  Patient is using long term (months to years) prescription medication  to control their dermatologic condition.  These medications require periodic monitoring to evaluate for efficacy and side effects and may require periodic laboratory monitoring.   ACNE VULGARIS, chronic, flaring, improved on treatment, not at patient goal Exam: Open and closed comedones forehead and cheeks, pitted scars cheeks. improved   Treatment Plan: Comedonal and inflammatory acne Discussed Isotretinoin and possible side effects. Recommend 6-8 mths course. Advised patient that she would need to be off of Isotretinoin before having weight loss surgery. Continue Tretinoin 0.025% cream every other night x 2 weeks then increase to nightly as tolerated.  Continue Doxycycline as above  Start  Spironolactone as above.    Topical retinoid medications like tretinoin/Retin-A, adapalene/Differin, tazarotene/Fabior, and Epiduo/Epiduo Forte can cause  dryness and irritation when first started. Only apply a pea-sized amount to the entire affected area. Avoid applying it around the eyes, edges of mouth and creases at the nose. If you experience irritation, use a good moisturizer first and/or apply the medicine less often. If you are doing well with the medicine, you can increase how often you use it until you are applying every night. Be careful with sun protection while using this medication as it can make you sensitive to the sun. This medicine should not be used by pregnant women.    Doxycycline should be taken with food to prevent nausea. Do not lay down for 30 minutes after taking. Be cautious with sun exposure and use good sun protection while on this medication. Pregnant women should not take this medication.    Spironolactone can cause increased urination and cause blood pressure to decrease. Please watch for signs of lightheadedness and be cautious when changing position. It can sometimes cause breast tenderness or an irregular period in premenopausal women. It can also increase potassium. The increase in potassium usually is not a concern unless you are taking other medicines that also increase potassium, so please be sure your doctor knows all of the other medications you are taking. This medication should not be taken by pregnant women.  This medicine should also not be taken together with sulfa drugs like Bactrim (trimethoprim/sulfamethexazole).   ATOPIC DERMATITIS Exam: hyperpigmented scaly patches on MCPs and dorsal digits R hand > L. Scaly fissuring on fingertips R hand 2% BSA  Chronic and persistent condition with duration or expected duration over one year. Condition is bothersome/symptomatic for patient. Currently flared.  Atopic dermatitis (eczema) is a chronic, relapsing, pruritic condition that can significantly affect quality of life. It is often associated with allergic rhinitis and/or asthma and can require treatment with  topical medications, phototherapy, or in severe cases biologic injectable medication (Dupixent; Adbry) or Oral JAK inhibitors.  Treatment Plan: Start Clobetasol 0.05% cream to aa's BID. Topical steroids (such as triamcinolone, fluocinolone, fluocinonide, mometasone, clobetasol, halobetasol, betamethasone, hydrocortisone) can cause thinning and lightening of the skin if they are used for too long in the same area. Your physician has selected the right strength medicine for your problem and area affected on the body. Please use your medication only as directed by your physician to prevent side effects.   Recommend gentle skin care.    Return in about 1 month (around 11/13/2023) for HS and acne follow up.  Maylene Roes, CMA, am acting as scribe for Elie Goody, MD .   Documentation: I have reviewed the above documentation for accuracy and completeness, and I agree with the above.  Elie Goody, MD

## 2023-11-14 ENCOUNTER — Encounter: Payer: Self-pay | Admitting: Dermatology

## 2023-11-14 ENCOUNTER — Ambulatory Visit: Payer: Medicaid Other | Admitting: Dermatology

## 2023-11-14 DIAGNOSIS — L209 Atopic dermatitis, unspecified: Secondary | ICD-10-CM

## 2023-11-14 DIAGNOSIS — L700 Acne vulgaris: Secondary | ICD-10-CM | POA: Diagnosis not present

## 2023-11-14 NOTE — Patient Instructions (Addendum)
 For eczema -   Treatment Plan: May continue Clobetasol 0.05% cream to aa's twice daily until smooth then discontinue. Avoid applying to face, groin, and axilla. Use as directed. Long-term use can cause thinning of the skin.  Topical steroids (such as triamcinolone, fluocinolone, fluocinonide, mometasone, clobetasol, halobetasol, betamethasone, hydrocortisone) can cause thinning and lightening of the skin if they are used for too long in the same area. Your physician has selected the right strength medicine for your problem and area affected on the body. Please use your medication only as directed by your physician to prevent side effects.    Recommend gentle skin care.  For acne -   Treatment Plan: Patient advised that oils used at scalp could be contributing to acne at face. Recommend washing with a gentle acne wash and moisturize after greasing scalp.   Continue spironolactone 50 mg every day Continue tretinoin 0.025% cr 3 nights weekly as tolerated Continue doxycycline 100 mg twice daily with food prn flares.    Topical retinoid medications like tretinoin/Retin-A, adapalene/Differin, tazarotene/Fabior, and Epiduo/Epiduo Forte can cause dryness and irritation when first started. Only apply a pea-sized amount to the entire affected area. Avoid applying it around the eyes, edges of mouth and creases at the nose. If you experience irritation, use a good moisturizer first and/or apply the medicine less often. If you are doing well with the medicine, you can increase how often you use it until you are applying every night. Be careful with sun protection while using this medication as it can make you sensitive to the sun. This medicine should not be used by pregnant women.   Spironolactone can cause increased urination and cause blood pressure to decrease. Please watch for signs of lightheadedness and be cautious when changing position. It can sometimes cause breast tenderness or an irregular period  in premenopausal women. It can also increase potassium. The increase in potassium usually is not a concern unless you are taking other medicines that also increase potassium, so please be sure your doctor knows all of the other medications you are taking. This medication should not be taken by pregnant women.  This medicine should also not be taken together with sulfa drugs like Bactrim (trimethoprim/sulfamethexazole).   Doxycycline should be taken with food to prevent nausea. Do not lay down for 30 minutes after taking. Be cautious with sun exposure and use good sun protection while on this medication. Pregnant women should not take this medication.   Due to recent changes in healthcare laws, you may see results of your pathology and/or laboratory studies on MyChart before the doctors have had a chance to review them. We understand that in some cases there may be results that are confusing or concerning to you. Please understand that not all results are received at the same time and often the doctors may need to interpret multiple results in order to provide you with the best plan of care or course of treatment. Therefore, we ask that you please give [place] 2 business days to thoroughly review all your results before contacting the office for clarification. Should we see a critical lab result, you will be contacted sooner.   If You Need Anything After Your Visit  If you have any questions or concerns for your doctor, please call our main line at [phone] and press option 4 to reach your doctor's medical assistant. If no one answers, please leave a voicemail as directed and we will return your call as soon as possible. Messages left  after 4 pm will be answered the following business day.   You may also send [place] a message via MyChart. We typically respond to MyChart messages within 1-2 business days.  For prescription refills, please ask your pharmacy to contact our office. Our fax number is  [phone].  If you have an urgent issue when the clinic is closed that cannot wait until the next business day, you can page your doctor at the number below.    Please note that while we do our best to be available for urgent issues outside of office hours, we are not available 24/7.   If you have an urgent issue and are unable to reach [place], you may choose to seek medical care at your doctor's office, retail clinic, urgent care center, or emergency room.  If you have a medical emergency, please immediately call 911 or go to the emergency department.  Pager Numbers  - Dr. Gwen Pounds: [phone]  - Dr. Roseanne Reno: [phone]  - Dr. Katrinka Blazing: [phone]   In the event of inclement weather, please call our main line at [phone] for an update on the status of any delays or closures.  Dermatology Medication Tips: Please keep the boxes that topical medications come in in order to help keep track of the instructions about where and how to use these. Pharmacies typically print the medication instructions only on the boxes and not directly on the medication tubes.   If your medication is too expensive, please contact our office at [phone] option 4 or send [place] a message through MyChart.   We are unable to tell what your co-pay for medications will be in advance as this is different depending on your insurance coverage. However, we may be able to find a substitute medication at lower cost or fill out paperwork to get insurance to cover a needed medication.   If a prior authorization is required to get your medication covered by your insurance company, please allow [place] 1-2 business days to complete this process.  Drug prices often vary depending on where the prescription is filled and some pharmacies may offer cheaper prices.  The website www.goodrx.com contains coupons for medications through different pharmacies. The prices here do not account for what the cost may be with help from  insurance (it may be cheaper with your insurance), but the website can give you the price if you did not use any insurance.  - You can print the associated coupon and take it with your prescription to the pharmacy.  - You may also stop by our office during regular business hours and pick up a GoodRx coupon card.  - If you need your prescription sent electronically to a different pharmacy, notify our office through [facility] or by phone at [phone] option 4.     Si Usted Necesita Algo Despus de Su Visita  Tambin puede enviarnos un mensaje a travs de Clinical cytogeneticist. Por lo general respondemos a los mensajes de MyChart en el transcurso de 1 a 2 das hbiles.  Para renovar recetas, por favor pida a su farmacia que se ponga en contacto con nuestra oficina. Annie Sable de fax es [place] [phone].  Si tiene un asunto urgente cuando la clnica est cerrada y que no puede esperar hasta el siguiente da hbil, puede llamar/localizar a su doctor(a) al nmero que aparece a continuacin.   Por favor, tenga en cuenta que aunque hacemos todo lo posible para estar disponibles para asuntos urgentes fuera del horario de oficina, no estamos  disponibles las 24 horas del da, los 7 [address]  [address] de la [place].   Si tiene un problema urgente y no puede comunicarse con nosotros, puede optar por buscar atencin mdica  en el consultorio de su doctor(a), en una clnica privada, en un centro de atencin urgente o en una sala de emergencias.  Si tiene Engineer, drilling, por favor llame inmediatamente al 911 o vaya a la sala de emergencias.  Nmeros de bper  - Dr. Gwen Pounds: [phone]  - Dra. Roseanne Reno: [phone]  - Dr. Katrinka Blazing: [phone]   En caso de inclemencias del tiempo, por favor llame a Lacy Duverney principal al [phone] para una actualizacin sobre el [place] de cualquier retraso o cierre.  Consejos para la medicacin en dermatologa: Por favor, guarde las cajas en las que vienen los  medicamentos de uso tpico para ayudarle a seguir las instrucciones sobre dnde y cmo usarlos. Las farmacias generalmente imprimen las instrucciones del medicamento slo en las cajas y no directamente en los tubos del [place].   Si su medicamento es muy caro, por favor, pngase en contacto con Rolm Gala llamando al [phone] y presione la opcin 4 o envenos un mensaje a travs de Clinical cytogeneticist.   No podemos decirle cul ser su copago por los medicamentos por adelantado ya que esto es diferente dependiendo de la cobertura de su seguro. Sin embargo, es posible que podamos encontrar un medicamento sustituto a Audiological scientist un formulario para que el seguro cubra el medicamento que se considera necesario.   Si se requiere una autorizacin previa para que su compaa de seguros [place] su medicamento, por favor permtanos de 1 a 2 das hbiles para completar [address]  Los precios de los medicamentos varan con frecuencia dependiendo del Environmental consultant de dnde se surte la receta y alguna farmacias pueden ofrecer precios ms baratos.  El sitio web www.goodrx.com tiene cupones para medicamentos de Health and safety inspector. Los precios aqu no tienen en cuenta lo que podra costar con la ayuda del seguro (puede ser ms barato con su seguro), pero el sitio web puede darle el precio si no utiliz Tourist information centre manager.  - Puede imprimir el cupn correspondiente y llevarlo con su receta a la farmacia.  - Tambin puede pasar por nuestra oficina durante el horario de atencin regular y Education officer, museum una tarjeta de cupones de GoodRx.  - Si necesita que su receta se enve electrnicamente a una farmacia diferente, informe a nuestra oficina a travs de MyChart de [place] o por telfono llamando al [phone] y presione la opcin 4.

## 2023-11-14 NOTE — Progress Notes (Signed)
   Follow-Up Visit   Subjective  Veronica Ochoa is a 33 y.o. female who presents for the following: 1 month HS and acne follow up. Taking spironolactone 50 mg once daily, doxycycline 100 mg twice daily, using tretinoin 0.025 % cream 3 nights a week. Patient's skin gets irritated if she uses it every night. Patient feels that acne is slowly improving. HS seems to be resolved currently with no active areas today.  Using clobetasol cream once daily to hands for eczema and she has had improvement.  The patient has spots, moles and lesions to be evaluated, some may be new or changing and the patient may have concern these could be cancer.   The following portions of the chart were reviewed this encounter and updated as appropriate: medications, allergies, medical history  Review of Systems:  No other skin or systemic complaints except as noted in HPI or Assessment and Plan.  Objective  Well appearing patient in no apparent distress; mood and affect are within normal limits.   A focused examination was performed of the following areas: Hands, face  Relevant exam findings are noted in the Assessment and Plan.    Assessment & Plan   ACNE VULGARIS Exam: Open and closed comedones on forehead temples lateral face, few on cheeks  Chronic Improved but flared  Treatment Plan: Patient advised that oils used at scalp could be contributing to acne at face. Recommend washing with a gentle acne wash and moisturize after greasing scalp.   Continue spironolactone 50 mg every day, no side effects Continue tretinoin 0.025% cr 3 nights weekly as tolerated Continue doxycycline 100 mg bid with food prn flares.   ATOPIC DERMATITIS Exam: hyperpigmented patches on R lateral AC fossa 1% BSA   Chronic and persistent condition with duration or expected duration over one year. Condition is bothersome/symptomatic for patient. Currently flared.   Atopic dermatitis (eczema) is a chronic, relapsing,  pruritic condition that can significantly affect quality of life. It is often associated with allergic rhinitis and/or asthma and can require treatment with topical medications, phototherapy, or in severe cases biologic injectable medication (Dupixent; Adbry) or Oral JAK inhibitors.   Treatment Plan: May continue Clobetasol 0.05% cream to aa's twice daily until smooth then discontinue. Avoid applying to face, groin, and axilla. Use as directed. Long-term use can cause thinning of the skin. AC fossa is delicate so stop when clear  Topical steroids (such as triamcinolone, fluocinolone, fluocinonide, mometasone, clobetasol, halobetasol, betamethasone, hydrocortisone) can cause thinning and lightening of the skin if they are used for too long in the same area. Your physician has selected the right strength medicine for your problem and area affected on the body. Please use your medication only as directed by your physician to prevent side effects.    Recommend gentle skin care.   ACNE VULGARIS   ATOPIC DERMATITIS, UNSPECIFIED TYPE    Return in about 6 months (around 05/16/2024) for acne, Eczema.  Anise Salvo, RMA, am acting as scribe for Elie Goody, MD .   Documentation: I have reviewed the above documentation for accuracy and completeness, and I agree with the above.  Elie Goody, MD

## 2023-11-30 ENCOUNTER — Ambulatory Visit: Admitting: Cardiology

## 2023-11-30 ENCOUNTER — Encounter: Payer: Self-pay | Admitting: Cardiology

## 2023-11-30 VITALS — BP 114/66 | HR 69 | Ht 67.5 in | Wt 276.0 lb

## 2023-11-30 DIAGNOSIS — Z0130 Encounter for examination of blood pressure without abnormal findings: Secondary | ICD-10-CM

## 2023-11-30 DIAGNOSIS — N760 Acute vaginitis: Secondary | ICD-10-CM | POA: Diagnosis not present

## 2023-11-30 LAB — POCT URINALYSIS DIPSTICK
Bilirubin, UA: NEGATIVE
Blood, UA: NEGATIVE
Glucose, UA: NEGATIVE
Ketones, UA: NEGATIVE
Leukocytes, UA: NEGATIVE
Nitrite, UA: NEGATIVE
Protein, UA: NEGATIVE
Spec Grav, UA: 1.01 (ref 1.010–1.025)
Urobilinogen, UA: 0.2 U/dL
pH, UA: 7.5 (ref 5.0–8.0)

## 2023-11-30 MED ORDER — FLUCONAZOLE 150 MG PO TABS
ORAL_TABLET | ORAL | 1 refills | Status: DC
Start: 2023-11-30 — End: 2024-01-17

## 2023-11-30 NOTE — Progress Notes (Signed)
 Established Patient Office Visit  Subjective:  Patient ID: Veronica Ochoa, female    DOB: 1990/12/19  Age: 33 y.o. MRN: [id number]  Chief Complaint  Patient presents with   Acute Visit    Possible Yeast Infection/ BV    Patient in office for an acute visit, complaining of non odorous discharge, itching. UA today normal. Patient reports discharge is milky. Patient states she is unaware if she has been exposed to STDs. Will have patient swab for BV/yeast. Will also test for STDs. Diflucan sent in.   Vaginal Discharge The patient's primary symptoms include genital itching and vaginal discharge. The patient's pertinent negatives include no genital odor or pelvic pain. This is a new problem. The current episode started in the past 7 days. The problem occurs constantly. The problem has been unchanged. The problem affects both sides. She is not pregnant. The vaginal discharge was milky, white and thin. There has been no bleeding. She has not been passing clots. She has not been passing tissue. Nothing aggravates the symptoms. She has tried nothing for the symptoms. She is sexually active. It is unknown whether or not her partner has an STD. Her menstrual history has been regular.    No other concerns at this time.   Past Medical History:  Diagnosis Date   Anxiety    Depression     Past Surgical History:  Procedure Laterality Date   CESAREAN SECTION  06/2020   HYDRADENITIS EXCISION Bilateral 03/14/2021   Procedure: EXCISION OF BILATERAL HIDRADENITIS AXILLA  AND RECURRENT ABCESS;  Surgeon: Luretha Murphy, MD;  Location: [facility];  Service: General;  Laterality: Bilateral;   miscarriage      Social History   Socioeconomic History   Marital status: Single    Spouse name: Not on file   Number of children: 1   Years of education: Not on file   Highest education level: Not on file  Occupational History   Not on file  Tobacco Use   Smoking status: Never   Smokeless tobacco: Never   Vaping Use   Vaping status: Never Used  Substance and Sexual Activity   Alcohol use: Not Currently    Comment: occasional   Drug use: No   Sexual activity: Not Currently    Partners: Male    Birth control/protection: None  Other Topics Concern   Not on file  Social History Narrative   Not on file   Social Drivers of Health   Financial Resource Strain: Low Risk  (03/14/2023)   Received from [facility]   Overall Financial Resource Strain (CARDIA)    Difficulty of Paying Living Expenses: Not hard at all  Food Insecurity: No Food Insecurity (03/14/2023)   Received from [facility]   Hunger Vital Sign    Worried About Running Out of Food in the Last Year: Never true    Ran Out of Food in the Last Year: Never true  Transportation Needs: No Transportation Needs (03/14/2023)   Received from [facility] - Transportation    Lack of Transportation (Medical): No    Lack of Transportation (Non-Medical): No  Physical Activity: Sufficiently Active (03/14/2023)   Received from [facility]   Exercise Vital Sign    Days of Exercise per Week: 5 days    Minutes of Exercise per Session: 30 min  Stress: Stress Concern Present (03/14/2023)   Received from [facility] - Occupational Stress Questionnaire  Feeling of Stress : To some extent  Social Connections: Socially Isolated (03/14/2023)   Received from [facility]   Social Network    How would you rate your social network (family, work, friends)?: Little participation, lonely and socially isolated  Intimate Partner Violence: Not At Risk (03/14/2023)   Received from [facility]   HITS    Over the last 12 months how often did your partner physically hurt you?: Never    Over the last 12 months how often did your partner insult you or talk down to you?: Never    Over the last 12 months how often did your partner threaten you with physical harm?: Rarely    Over the last 12 months  how often did your partner scream or curse at you?: Sometimes    Family History  Problem Relation Age of Onset   Diabetes Father    Arthritis Maternal Grandmother    Diabetes Maternal Grandmother    Asthma Maternal Grandmother     Allergies  Allergen Reactions   Other Hives and Other (See Comments)   Vancomycin Hives and Swelling   Latex Swelling   Morphine And Codeine Hives    Outpatient Medications Prior to Visit  Medication Sig   clobetasol cream (TEMOVATE) 0.05 % Apply to aa's hand BID until smooth. Then stop and use PRN. Avoid applying to face, groin, and axilla. Use as directed. Long-term use can cause thinning of the skin.   spironolactone (ALDACTONE) 50 MG tablet Take one tab po QD x 2 weeks. If tolerating well, increase to one tab po BID.   tretinoin (RETIN-A) 0.025 % cream Apply to face QHS for acne.   No facility-administered medications prior to visit.    Review of Systems  Genitourinary:  Positive for vaginal discharge. Negative for pelvic pain.       Objective:   BP 114/66   Pulse 69   Ht 5' 7.5" (1.715 m)   Wt 276 lb (125.2 kg)   SpO2 99%   BMI 42.59 kg/m   Vitals:   11/30/23 1053  BP: 114/66  Pulse: 69  Height: 5' 7.5" (1.715 m)  Weight: 276 lb (125.2 kg)  SpO2: 99%  BMI (Calculated): 42.56    Physical Exam   Results for orders placed or performed in visit on 11/30/23  POCT Urinalysis Dipstick (81002)  Result Value Ref Range   Color, UA     Clarity, UA     Glucose, UA Negative Negative   Bilirubin, UA Negative    Ketones, UA Negative    Spec Grav, UA 1.010 1.010 - 1.025   Blood, UA Negative    pH, UA 7.5 5.0 - 8.0   Protein, UA Negative Negative   Urobilinogen, UA 0.2 0.2 or 1.0 E.U./dL   Nitrite, UA Negative    Leukocytes, UA Negative Negative   Appearance     Odor      Recent Results (from the past 2160 hours)  POCT Urinalysis Dipstick (57846)     Status: Normal   Collection Time: 11/30/23 11:12 AM  Result Value Ref  Range   Color, UA     Clarity, UA     Glucose, UA Negative Negative   Bilirubin, UA Negative    Ketones, UA Negative    Spec Grav, UA 1.010 1.010 - 1.025   Blood, UA Negative    pH, UA 7.5 5.0 - 8.0   Protein, UA Negative Negative   Urobilinogen, UA 0.2 0.2 or 1.0 E.U./dL  Nitrite, UA Negative    Leukocytes, UA Negative Negative   Appearance     Odor        Assessment & Plan:  Diflucan Swab for BV/yeast STD testing  Problem List Items Addressed This Visit       Genitourinary   Acute vaginitis - Primary   Relevant Orders   POCT Urinalysis Dipstick (81002) (Completed)   NuSwab BV and Candida, NAA   GC/Chlamydia Probe Amp([facility])    Return if symptoms worsen or fail to improve.   Total time spent: 25 minutes  Google, NP  11/30/2023   This document may have been prepared by Dragon Voice Recognition software and as such may include unintentional dictation errors.

## 2023-12-04 ENCOUNTER — Other Ambulatory Visit: Payer: Self-pay | Admitting: Cardiology

## 2023-12-04 LAB — GC/CHLAMYDIA PROBE AMP
Chlamydia trachomatis, NAA: NEGATIVE
Neisseria Gonorrhoeae by PCR: NEGATIVE

## 2023-12-04 LAB — SPECIMEN STATUS REPORT

## 2023-12-04 LAB — NUSWAB BV AND CANDIDA, NAA
Atopobium vaginae: HIGH {score} — AB
BVAB 2: HIGH {score} — AB
Candida albicans, NAA: NEGATIVE
Candida glabrata, NAA: NEGATIVE
Megasphaera 1: HIGH {score} — AB

## 2023-12-04 MED ORDER — METRONIDAZOLE 500 MG PO TABS: 500.0000 mg | ORAL_TABLET | Freq: Two times a day (BID) | ORAL | 0 refills | Status: AC

## 2024-01-10 ENCOUNTER — Ambulatory Visit: Admitting: Cardiology

## 2024-01-11 ENCOUNTER — Encounter: Payer: Self-pay | Admitting: Cardiology

## 2024-01-11 ENCOUNTER — Ambulatory Visit: Admitting: Cardiology

## 2024-01-11 VITALS — BP 120/56 | HR 85 | Ht 67.5 in | Wt 274.0 lb

## 2024-01-11 DIAGNOSIS — E66812 Obesity, class 2: Secondary | ICD-10-CM

## 2024-01-11 DIAGNOSIS — Z6839 Body mass index (BMI) 39.0-39.9, adult: Secondary | ICD-10-CM

## 2024-01-11 DIAGNOSIS — Z0130 Encounter for examination of blood pressure without abnormal findings: Secondary | ICD-10-CM

## 2024-01-11 NOTE — Progress Notes (Signed)
 Established Patient Office Visit  Subjective:  Patient ID: Veronica Ochoa, female    DOB: 1991-02-21  Age: 33 y.o. 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 Patient reports feeling well, no complaints today. Patient reports she is about [redacted] weeks pregnant.  Form completed for patient . Follow up after pregnancy.     No other concerns at this time.   Past Medical History:  Diagnosis Date   Anxiety    Depression     Past Surgical History:  Procedure Laterality Date   CESAREAN SECTION  06/2020   HYDRADENITIS EXCISION Bilateral 03/14/2021   Procedure: EXCISION OF BILATERAL HIDRADENITIS AXILLA  AND RECURRENT ABCESS;  Surgeon: Jacolyn Matar, MD;  Location: [facility];  Service: General;  Laterality: Bilateral;   miscarriage      Social History   Socioeconomic History   Marital status: Single    Spouse name: Not on file   Number of children: 1   Years of education: Not on file   Highest education level: Not on file  Occupational History   Not on file  Tobacco Use   Smoking status: Never   Smokeless tobacco: Never  Vaping Use   Vaping status: Never Used  Substance and Sexual Activity   Alcohol use: Not Currently    Comment: occasional   Drug use: No   Sexual activity: Not Currently    Partners: Male    Birth control/protection: None  Other Topics Concern   Not on file  Social History Narrative   Not on file   Social Drivers of Health   Financial Resource Strain: Low Risk  (03/14/2023)   Received from [facility]   Overall Financial Resource Strain (CARDIA)    Difficulty of Paying Living Expenses: Not hard at all  Food Insecurity: No Food Insecurity (03/14/2023)   Received from [facility]   Hunger Vital Sign    Worried About Running Out of Food in the Last Year: Never true    Ran Out of Food in the Last Year: Never  true  Transportation Needs: No Transportation Needs (03/14/2023)   Received from [facility] - Transportation    Lack of Transportation (Medical): No    Lack of Transportation (Non-Medical): No  Physical Activity: Sufficiently Active (03/14/2023)   [facility]   Exercise Vital Sign    Days of Exercise per Week: 5 days    Minutes of Exercise per Session: 30 min  Stress: Stress Concern Present (03/14/2023)   Received from [facility] - Occupational Stress Questionnaire    Feeling of Stress : To some extent  Social Connections: Socially Isolated (03/14/2023)   Received from [facility]   Social Network    How would you rate your social network (family, work, friends)?: Little participation, lonely and socially isolated  Intimate Partner Violence: Not At Risk (03/14/2023)   Received from [facility]   HITS    Over the last 12 months how often did your partner physically hurt you?: Never    Over the last 12 months how often did your partner insult you or talk down to you?: Never    Over the last 12 months how often did your partner threaten you with physical harm?: Rarely    Over the last 12 months how  often did your partner scream or curse at you?: Sometimes    Family History  Problem Relation Age of Onset   Diabetes Father    Arthritis Maternal Grandmother    Diabetes Maternal Grandmother    Asthma Maternal Grandmother     Allergies  Allergen Reactions   Other Hives and Other (See Comments)   Vancomycin Hives and Swelling   Latex Swelling   Morphine And Codeine  Hives    Outpatient Medications Prior to Visit  Medication Sig   clobetasol  cream (TEMOVATE ) 0.05 % Apply to aa's hand BID until smooth. Then stop and use PRN. Avoid applying to face, groin, and axilla. Use as directed. Long-term use can cause thinning of the skin.   fluconazole  (DIFLUCAN ) 150 MG tablet Take one tablet every 72 hours for 2 doses    spironolactone  (ALDACTONE ) 50 MG tablet Take one tab po QD x 2 weeks. If tolerating well, increase to one tab po BID.   tretinoin  (RETIN-A ) 0.025 % cream Apply to face QHS for acne.   No facility-administered medications prior to visit.    Review of Systems  Constitutional: Negative.   HENT: Negative.    Eyes: Negative.   Respiratory: Negative.  Negative for shortness of breath.   Cardiovascular: Negative.  Negative for chest pain.  Gastrointestinal: Negative.  Negative for abdominal pain, constipation and diarrhea.  Genitourinary: Negative.   Musculoskeletal:  Negative for joint pain and myalgias.  Skin: Negative.   Neurological: Negative.  Negative for dizziness and headaches.  Endo/Heme/Allergies: Negative.   All other systems reviewed and are negative.      Objective:   BP (!) 120/56   Pulse 85   Ht 5' 7.5" (1.715 m)   Wt 274 lb (124.3 kg)   SpO2 98%   BMI 42.28 kg/m   Vitals:   01/11/24 1024  BP: (!) 120/56  Pulse: 85  Height: 5' 7.5" (1.715 m)  Weight: 274 lb (124.3 kg)  SpO2: 98%  BMI (Calculated): 42.26    Physical Exam Vitals and nursing note reviewed.  Constitutional:      Appearance: Normal appearance. She is normal weight.  HENT:     Head: Normocephalic and atraumatic.     Nose: Nose normal.     Mouth/Throat:     Mouth: Mucous membranes are moist.  Eyes:     Extraocular Movements: Extraocular movements intact.     Conjunctiva/sclera: Conjunctivae normal.     Pupils: Pupils are equal, round, and reactive to light.  Cardiovascular:     Rate and Rhythm: Normal rate and regular rhythm.     Pulses: Normal pulses.     Heart sounds: Normal heart sounds.  Pulmonary:     Effort: Pulmonary effort is normal.     Breath sounds: Normal breath sounds.  Abdominal:     General: Abdomen is flat. Bowel sounds are normal.     Palpations: Abdomen is soft.  Musculoskeletal:        General: Normal range of motion.     Cervical back: Normal range of motion.   Skin:    General: Skin is warm and dry.  Neurological:     General: No focal deficit present.     Mental Status: She is alert and oriented to person, place, and time.  Psychiatric:        Mood and Affect: Mood normal.        Behavior: Behavior normal.        Thought Content: Thought content normal.  Judgment: Judgment normal.      No results found for any visits on 01/11/24.  Recent Results (from the past 2160 hours)  NuSwab BV and Candida, NAA     Status: Abnormal   Collection Time: 11/30/23 12:00 AM  Result Value Ref Range   Atopobium vaginae High - 2 (A) Score   BVAB 2 High - 2 (A) Score   Megasphaera 1 High - 2 (A) Score    Comment: Calculate total score by adding the 3 individual bacterial vaginosis (BV) marker scores together.  Total score is interpreted as follows: Total score 0-1: Indicates the absence of BV. Total score   2: Indeterminate for BV. Additional clinical                  data should be evaluated to establish a                  diagnosis. Total score 3-6: Indicates the presence of BV.    Candida albicans, NAA Negative Negative   Candida glabrata, NAA Negative Negative  GC/Chlamydia Probe Amp([facility])     Status: None   Collection Time: 11/30/23 12:00 AM  Result Value Ref Range   Chlamydia trachomatis, NAA Negative Negative   Neisseria Gonorrhoeae by PCR Negative Negative  Specimen status report     Status: None   Collection Time: 11/30/23 12:00 AM  Result Value Ref Range   specimen status report Comment     Comment: Please note Please note The date and/or time of collection was not indicated on the requisition as required by state and federal law.  The date of receipt of the specimen was used as the collection date if not supplied.   Specimen status report     Status: None   Collection Time: 11/30/23 12:00 AM  Result Value Ref Range   specimen status report Comment     Comment: Please note Please note The date and/or time of  collection was not indicated on the requisition as required by state and federal law.  The date of receipt of the specimen was used as the collection date if not supplied.   POCT Urinalysis Dipstick (16109)     Status: Normal   Collection Time: 11/30/23 11:12 AM  Result Value Ref Range   Color, UA     Clarity, UA     Glucose, UA Negative Negative   Bilirubin, UA Negative    Ketones, UA Negative    Spec Grav, UA 1.010 1.010 - 1.025   Blood, UA Negative    pH, UA 7.5 5.0 - 8.0   Protein, UA Negative Negative   Urobilinogen, UA 0.2 0.2 or 1.0 E.U./dL   Nitrite, UA Negative    Leukocytes, UA Negative Negative   Appearance     Odor        Assessment & Plan:  Follow up after pregnancy  Problem List Items Addressed This Visit       Other   Obesity, unspecified - Primary    Return in about 1 year (around 01/10/2025).   Total time spent: 20 minutes  Google, NP  01/11/2024   This document may have been prepared by Dragon Voice Recognition software and as such may include unintentional dictation errors.

## 2024-01-17 ENCOUNTER — Ambulatory Visit (HOSPITAL_BASED_OUTPATIENT_CLINIC_OR_DEPARTMENT_OTHER): Admitting: *Deleted

## 2024-01-17 ENCOUNTER — Other Ambulatory Visit (HOSPITAL_COMMUNITY)
Admission: RE | Admit: 2024-01-17 | Discharge: 2024-01-17 | Disposition: A | Discharge status: Home / Self Care | Source: Ambulatory Visit | Attending: Obstetrics & Gynecology | Admitting: Obstetrics & Gynecology

## 2024-01-17 ENCOUNTER — Encounter (HOSPITAL_BASED_OUTPATIENT_CLINIC_OR_DEPARTMENT_OTHER): Payer: Self-pay | Admitting: *Deleted

## 2024-01-17 VITALS — BP 120/71 | HR 87 | Wt 277.0 lb

## 2024-01-17 DIAGNOSIS — Z3A01 Less than 8 weeks gestation of pregnancy: Secondary | ICD-10-CM

## 2024-01-17 DIAGNOSIS — O0990 Supervision of high risk pregnancy, unspecified, unspecified trimester: Secondary | ICD-10-CM | POA: Insufficient documentation

## 2024-01-17 DIAGNOSIS — Z3481 Encounter for supervision of other normal pregnancy, first trimester: Secondary | ICD-10-CM | POA: Diagnosis present

## 2024-01-17 DIAGNOSIS — Z8751 Personal history of pre-term labor: Secondary | ICD-10-CM

## 2024-01-17 DIAGNOSIS — Z3480 Encounter for supervision of other normal pregnancy, unspecified trimester: Secondary | ICD-10-CM

## 2024-01-17 DIAGNOSIS — Z3A08 8 weeks gestation of pregnancy: Secondary | ICD-10-CM

## 2024-01-17 MED ORDER — BLOOD PRESSURE KIT DEVI
1.0000 | Freq: Once | 0 refills | Status: AC
Start: 2024-01-17 — End: 2024-01-17

## 2024-01-17 NOTE — Progress Notes (Signed)
 New OB Intake  I explained I am completing New OB Intake today. We discussed EDD of Not found.. Pt is G3P1101. I reviewed her allergies, medications and Medical/Surgical/OB history.    Patient Active Problem List   Diagnosis Date Noted   History of preterm delivery 01/18/2024   Supervision of high risk pregnancy, antepartum 01/17/2024     Concerns addressed today  Delivery Plans Plans to deliver at [facility]. Discussed the nature of our practice with multiple providers including residents and students. Due to the size of the practice, the delivering provider may not be the same as those providing prenatal care.   Patient is interested in water birth.  MyChart/Babyscripts MyChart access verified. I explained pt will have some visits in office and some virtually. Babyscripts instructions given and order placed. Patient verifies receipt of registration text/e-mail. Account successfully created and app downloaded. If patient is a candidate for Optimized scheduling, add to sticky note.   Blood Pressure Cuff/Weight Scale Blood pressure cuff ordered for patient to pick-up from [employer]. Explained after first prenatal appt pt will check weekly and document in Babyscripts. Patient does have weight scale.  Anatomy US  Explained first scheduled US  will be around 19 weeks.   Is patient a CenteringPregnancy candidate?  Declined Declined due to Schedule    Is patient a Mom+Baby Combined Care candidate?  Not a candidate   If accepted, confirm patient does not intend to move from the area for at least 12 months, then notify Mom+Baby staff  Is patient a candidate for Babyscripts Optimization? No, due to history of 22 week delivery   First visit review I reviewed new OB appt with patient. Explained pt will be seen by Hezzie Loupe, CNM at first visit. Discussed [facility] [place] genetic screening with patient. Pt would like both Panorama and Horizon.. Routine prenatal labs collected. Will have natera labs  done at next visit, too early today.   Last Pap 02/19/23 normal; HPV negative  Marliss Simple, RN 01/18/2024  12:12 PM

## 2024-01-18 DIAGNOSIS — Z8751 Personal history of pre-term labor: Secondary | ICD-10-CM | POA: Insufficient documentation

## 2024-01-18 LAB — CERVICOVAGINAL ANCILLARY ONLY
Bacterial Vaginitis (gardnerella): NEGATIVE
Candida Glabrata: NEGATIVE
Candida Vaginitis: POSITIVE — AB
Chlamydia: NEGATIVE
Comment: NEGATIVE
Comment: NEGATIVE
Comment: NEGATIVE
Comment: NEGATIVE
Comment: NEGATIVE
Comment: NORMAL
Neisseria Gonorrhea: NEGATIVE
Trichomonas: NEGATIVE

## 2024-01-18 LAB — CBC
Hematocrit: 36 % (ref 34.0–46.6)
Hemoglobin: 11.3 g/dL (ref 11.1–15.9)
MCH: 27.6 pg (ref 26.6–33.0)
MCHC: 31.4 g/dL — ABNORMAL LOW (ref 31.5–35.7)
MCV: 88 fL (ref 79–97)
Platelets: 341 10*3/uL (ref 150–450)
RBC: 4.09 x10E6/uL (ref 3.77–5.28)
RDW: 13.2 % (ref 11.7–15.4)
WBC: 11.7 10*3/uL — ABNORMAL HIGH (ref 3.4–10.8)

## 2024-01-18 LAB — ANTIBODY SCREEN: Antibody Screen: NEGATIVE

## 2024-01-18 LAB — RPR: RPR Ser Ql: NONREACTIVE

## 2024-01-18 LAB — HIV ANTIBODY (ROUTINE TESTING W REFLEX): HIV Screen 4th Generation wRfx: NONREACTIVE

## 2024-01-18 LAB — HEPATITIS B SURFACE ANTIGEN: Hepatitis B Surface Ag: NEGATIVE

## 2024-01-18 LAB — ABO/RH: Rh Factor: POSITIVE

## 2024-01-18 LAB — RUBELLA SCREEN: Rubella Antibodies, IGG: 2.17 {index} (ref >0.99)

## 2024-01-18 LAB — HEPATITIS C ANTIBODY: Hep C Virus Ab: NONREACTIVE

## 2024-01-18 LAB — HEMOGLOBIN A1C
Est. average glucose Bld gHb Est-mCnc: 114 mg/dL
Hgb A1c MFr Bld: 5.6 % (ref 4.8–5.6)

## 2024-01-19 LAB — URINE CULTURE, OB REFLEX: Organism ID, Bacteria: NO GROWTH

## 2024-01-19 LAB — CULTURE, OB URINE

## 2024-01-22 ENCOUNTER — Other Ambulatory Visit (HOSPITAL_BASED_OUTPATIENT_CLINIC_OR_DEPARTMENT_OTHER): Payer: Self-pay | Admitting: *Deleted

## 2024-01-22 DIAGNOSIS — O0990 Supervision of high risk pregnancy, unspecified, unspecified trimester: Secondary | ICD-10-CM

## 2024-01-22 DIAGNOSIS — Z8751 Personal history of pre-term labor: Secondary | ICD-10-CM

## 2024-01-22 MED ORDER — FLUCONAZOLE 150 MG PO TABS: 150.0000 mg | ORAL_TABLET | Freq: Once | ORAL | 0 refills | Status: AC

## 2024-01-22 MED ORDER — PROMETHAZINE HCL 25 MG PO TABS: 25.0000 mg | ORAL_TABLET | Freq: Four times a day (QID) | ORAL | 0 refills | Status: DC | PRN

## 2024-01-22 MED ORDER — DOXYLAMINE-PYRIDOXINE 10-10 MG PO TBEC: DELAYED_RELEASE_TABLET | ORAL | 0 refills | Status: DC

## 2024-01-22 NOTE — Progress Notes (Signed)
 Pt called requesting nausea medication. Rx sent to pharmacy. Pt also asks about positive yeast on swab. Pt reports that she can not use monistat as she had a reaction the last time she tried that consisted of burning and redness. Provider notified and order received to send monistat 150mg  x 1 dose to pharmacy.

## 2024-01-23 ENCOUNTER — Ambulatory Visit (HOSPITAL_BASED_OUTPATIENT_CLINIC_OR_DEPARTMENT_OTHER): Payer: Self-pay | Admitting: Certified Nurse Midwife

## 2024-01-31 ENCOUNTER — Encounter: Payer: Self-pay | Admitting: Emergency Medicine

## 2024-01-31 ENCOUNTER — Emergency Department: Admission: EM | Admit: 2024-01-31 | Discharge: 2024-01-31 | Disposition: A | Discharge status: Home / Self Care

## 2024-01-31 ENCOUNTER — Other Ambulatory Visit: Payer: Self-pay

## 2024-01-31 DIAGNOSIS — O99611 Diseases of the digestive system complicating pregnancy, first trimester: Secondary | ICD-10-CM | POA: Insufficient documentation

## 2024-01-31 DIAGNOSIS — R197 Diarrhea, unspecified: Secondary | ICD-10-CM | POA: Diagnosis not present

## 2024-01-31 DIAGNOSIS — Z3A11 11 weeks gestation of pregnancy: Secondary | ICD-10-CM | POA: Insufficient documentation

## 2024-01-31 DIAGNOSIS — D72829 Elevated white blood cell count, unspecified: Secondary | ICD-10-CM | POA: Diagnosis not present

## 2024-01-31 DIAGNOSIS — O219 Vomiting of pregnancy, unspecified: Secondary | ICD-10-CM | POA: Diagnosis present

## 2024-01-31 DIAGNOSIS — R112 Nausea with vomiting, unspecified: Secondary | ICD-10-CM

## 2024-01-31 LAB — COMPREHENSIVE METABOLIC PANEL WITH GFR
ALT: 20 U/L (ref 0–44)
AST: 15 U/L (ref 15–41)
Albumin: 3.7 g/dL (ref 3.5–5.0)
Alkaline Phosphatase: 41 U/L (ref 38–126)
Anion gap: 5 (ref 5–15)
BUN: 13 mg/dL (ref 6–20)
CO2: 24 mmol/L (ref 22–32)
Calcium: 8.9 mg/dL (ref 8.9–10.3)
Chloride: 106 mmol/L (ref 98–111)
Creatinine, Ser: 0.52 mg/dL (ref 0.44–1.00)
GFR, Estimated: >60 mL/min (ref >60)
Glucose, Bld: 100 mg/dL — ABNORMAL HIGH (ref 70–99)
Potassium: 4 mmol/L (ref 3.5–5.1)
Sodium: 135 mmol/L (ref 135–145)
Total Bilirubin: 0.7 mg/dL (ref 0.0–1.2)
Total Protein: 6.9 g/dL (ref 6.5–8.1)

## 2024-01-31 LAB — URINALYSIS, ROUTINE W REFLEX MICROSCOPIC
Bilirubin Urine: NEGATIVE
Glucose, UA: NEGATIVE mg/dL
Hgb urine dipstick: NEGATIVE
Ketones, ur: NEGATIVE mg/dL
Leukocytes,Ua: NEGATIVE
Nitrite: NEGATIVE
Protein, ur: NEGATIVE mg/dL
Specific Gravity, Urine: 1.011 (ref 1.005–1.030)
pH: 8 (ref 5.0–8.0)

## 2024-01-31 LAB — CBC
HCT: 31.1 % — ABNORMAL LOW (ref 36.0–46.0)
Hemoglobin: 10.5 g/dL — ABNORMAL LOW (ref 12.0–15.0)
MCH: 27.7 pg (ref 26.0–34.0)
MCHC: 33.8 g/dL (ref 30.0–36.0)
MCV: 82.1 fL (ref 80.0–100.0)
Platelets: 280 10*3/uL (ref 150–400)
RBC: 3.79 MIL/uL — ABNORMAL LOW (ref 3.87–5.11)
RDW: 14.5 % (ref 11.5–15.5)
WBC: 10.9 10*3/uL — ABNORMAL HIGH (ref 4.0–10.5)
nRBC: 0 % (ref 0.0–0.2)

## 2024-01-31 LAB — LIPASE, BLOOD: Lipase: 24 U/L (ref 11–51)

## 2024-01-31 LAB — POC URINE PREG, ED: Preg Test, Ur: POSITIVE — AB

## 2024-01-31 MED ORDER — ACETAMINOPHEN 325 MG PO TABS
650.0000 mg | ORAL_TABLET | Freq: Once | ORAL | Status: AC
Start: 2024-01-31 — End: 2024-01-31
  Administered 2024-01-31: 650 mg via ORAL
  Filled 2024-01-31: qty 2

## 2024-01-31 MED ORDER — VITAMIN B-6 25 MG PO TABS: 25.0000 mg | ORAL_TABLET | Freq: Every day | ORAL | 0 refills | Status: AC

## 2024-01-31 MED ORDER — PYRIDOXINE HCL 25 MG PO TABS
25.0000 mg | ORAL_TABLET | Freq: Every day | ORAL | Status: DC
  Administered 2024-01-31: 25 mg via ORAL
  Filled 2024-01-31: qty 1

## 2024-01-31 NOTE — Discharge Instructions (Addendum)
 You may take the medication as prescribed to help with your nausea.  Please remember to stay hydrated and eat bland foods.  Please return for any new, worsening, or change in symptoms or other concerns.  It was a pleasure caring for you today.

## 2024-01-31 NOTE — ED Triage Notes (Signed)
 Patient to ED via POV for abd pain. Started at 0300 this AM with N/V/D. Currently [redacted] weeks pregnant. Denies vaginal bleeding or discharge.

## 2024-01-31 NOTE — ED Provider Notes (Signed)
 [facility] Provider Note    Event Date/Time   First MD Initiated Contact with Patient 01/31/24 0920     (approximate)   History   Abdominal Pain   HPI  Veronica Ochoa is a 33 y.o. female G3, P1 currently estimated to be [redacted] weeks pregnant who presents today for evaluation of nausea, vomiting, diarrhea that began this morning.  Patient reports that she had Alfredo last night and her symptoms began shortly after.  She denies any blood in her emesis or stool.  No fevers.  No vaginal discharge or bleeding.  No abdominal/pelvic pain.  Patient Active Problem List   Diagnosis Date Noted   History of preterm delivery 01/18/2024   Supervision of high risk pregnancy, antepartum 01/17/2024          Physical Exam   Triage Vital Signs: ED Triage Vitals  Encounter Vitals Group     BP 01/31/24 0853 (!) 126/57     Systolic BP Percentile --      Diastolic BP Percentile --      Pulse Rate 01/31/24 0853 80     Resp 01/31/24 0853 17     Temp 01/31/24 0853 98.3 F (36.8 C)     Temp Source 01/31/24 0853 Oral     SpO2 01/31/24 0853 100 %     Weight 01/31/24 0854 270 lb (122.5 kg)     Height 01/31/24 0854 5\' 7"  (1.702 m)     Head Circumference --      Peak Flow --      Pain Score 01/31/24 0854 8     Pain Loc --      Pain Education --      Exclude from Growth Chart --     Most recent vital signs: Vitals:   01/31/24 0853  BP: (!) 126/57  Pulse: 80  Resp: 17  Temp: 98.3 F (36.8 C)  SpO2: 100%    Physical Exam Vitals and nursing note reviewed.  Constitutional:      General: Awake and alert. No acute distress.    Appearance: Normal appearance.   HENT:     Head: Normocephalic and atraumatic.     Mouth: Mucous membranes are moist.  Eyes:     General: PERRL. Normal EOMs        Right eye: No discharge.        Left eye: No discharge.     Conjunctiva/sclera: Conjunctivae normal.  Cardiovascular:     Rate and Rhythm: Normal rate and regular rhythm.      Pulses: Normal pulses.  Pulmonary:     Effort: Pulmonary effort is normal. No respiratory distress.     Breath sounds: Normal breath sounds.  Abdominal:     Abdomen is soft. There is no abdominal tenderness. No rebound or guarding. No distention. Musculoskeletal:        General: No swelling. Normal range of motion.     Cervical back: Normal range of motion and neck supple.  Skin:    General: Skin is warm and dry.     Capillary Refill: Capillary refill takes less than 2 seconds.     Findings: No rash.  Neurological:     Mental Status: The patient is awake and alert.      ED Results / Procedures / Treatments   Labs (all labs ordered are listed, but only abnormal results are displayed) Labs Reviewed  COMPREHENSIVE METABOLIC PANEL WITH GFR - Abnormal; Notable for the following components:  Result Value   Glucose, Bld 100 (*)    All other components within normal limits  CBC - Abnormal; Notable for the following components:   WBC 10.9 (*)    RBC 3.79 (*)    Hemoglobin 10.5 (*)    HCT 31.1 (*)    All other components within normal limits  URINALYSIS, ROUTINE W REFLEX MICROSCOPIC - Abnormal; Notable for the following components:   Color, Urine STRAW (*)    APPearance CLEAR (*)    All other components within normal limits  POC URINE PREG, ED - Abnormal; Notable for the following components:   Preg Test, Ur Positive (*)    All other components within normal limits  LIPASE, BLOOD     EKG     RADIOLOGY     PROCEDURES:  Critical Care performed:   Procedures   MEDICATIONS ORDERED IN ED: Medications  pyridOXINE  (VITAMIN B6) tablet 25 mg (25 mg Oral Given 01/31/24 1046)  acetaminophen  (TYLENOL ) tablet 650 mg (650 mg Oral Given 01/31/24 0938)     IMPRESSION / MDM / ASSESSMENT AND PLAN / ED COURSE  I reviewed the triage vital signs and the nursing notes.   Differential diagnosis includes, but is not limited to, gastroenteritis, dehydration, nausea in  pregnancy, electrolyte disarray.  Patient is awake and alert, hemodynamically stable and afebrile.  She is nontoxic in appearance.  She has no abdominal tenderness on exam.  Labs obtained are overall reassuring.  Normal LFTs and lipase, no epigastric tenderness, do not suspect cholecystitis.  She has a mild leukocytosis to 10.9 which is not unusual in pregnancy.  She has no right lower quadrant tenderness to suggest appendicitis.  She denies vaginal discharge to suggest STD or PID.  She has had normal ultrasounds with this pregnancy, demonstrating known IUP.  Denies any pregnancy concerns today.  She was treated symptomatically with Tylenol  and pyridoxine  with significant improvement of her symptoms.  Upon reevaluation, she reports that her symptoms have resolved.  She was given pyridoxine  as a prescription to use at home as well.  We discussed return precautions in the meantime.  Patient understands and agrees with plan.  She was discharged in stable condition.   Patient's presentation is most consistent with acute complicated illness / injury requiring diagnostic workup.   Clinical Course as of 01/31/24 1437  Thu Jan 31, 2024  1107 Patient reports that she feels significantly improved and ready for discharge [JP]    Clinical Course User Index [JP] Reese Senk E, PA-C     FINAL CLINICAL IMPRESSION(S) / ED DIAGNOSES   Final diagnoses:  Nausea vomiting and diarrhea     Rx / DC Orders   ED Discharge Orders          Ordered    pyridOXINE  (VITAMIN B6) 25 MG tablet  Daily        01/31/24 1108             Note:  This document was prepared using Dragon voice recognition software and may include unintentional dictation errors.   Javyon Fontan E, PA-C 01/31/24 1437    Collis Deaner, MD 02/03/24 [phone]

## 2024-01-31 NOTE — ED Notes (Addendum)
 32 yof with a c/c of vomiting and diarrhea since approx with mid abdominal pain. 1 am this morning. The pt advised the diarrhea started at 1 am this morning and she then vomited at 3 am this morning. The pt is warm, pink, and dry. The pt is alert and oriented x 4. Call bell at the bedside. Blanket provided for the pt.

## 2024-02-04 ENCOUNTER — Encounter (HOSPITAL_BASED_OUTPATIENT_CLINIC_OR_DEPARTMENT_OTHER): Admitting: Certified Nurse Midwife

## 2024-02-11 ENCOUNTER — Encounter (HOSPITAL_BASED_OUTPATIENT_CLINIC_OR_DEPARTMENT_OTHER): Admitting: Obstetrics and Gynecology

## 2024-02-15 ENCOUNTER — Telehealth (HOSPITAL_BASED_OUTPATIENT_CLINIC_OR_DEPARTMENT_OTHER): Payer: Self-pay | Admitting: *Deleted

## 2024-02-15 NOTE — Telephone Encounter (Signed)
 TC to pt. I called the pt to inquire about patient care as all OB appts.  Terri Fester CMA

## 2024-03-03 ENCOUNTER — Encounter (HOSPITAL_BASED_OUTPATIENT_CLINIC_OR_DEPARTMENT_OTHER): Admitting: Certified Nurse Midwife

## 2024-03-14 ENCOUNTER — Other Ambulatory Visit

## 2024-03-14 ENCOUNTER — Ambulatory Visit

## 2024-03-31 ENCOUNTER — Encounter (HOSPITAL_BASED_OUTPATIENT_CLINIC_OR_DEPARTMENT_OTHER): Admitting: Obstetrics & Gynecology

## 2024-04-18 ENCOUNTER — Ambulatory Visit: Admitting: Internal Medicine

## 2024-05-01 ENCOUNTER — Encounter (HOSPITAL_BASED_OUTPATIENT_CLINIC_OR_DEPARTMENT_OTHER): Admitting: Obstetrics & Gynecology

## 2024-05-20 ENCOUNTER — Ambulatory Visit: Admitting: Dermatology

## 2024-05-20 ENCOUNTER — Encounter: Payer: Self-pay | Admitting: Dermatology

## 2024-05-20 DIAGNOSIS — L700 Acne vulgaris: Secondary | ICD-10-CM | POA: Diagnosis not present

## 2024-05-20 DIAGNOSIS — L732 Hidradenitis suppurativa: Secondary | ICD-10-CM | POA: Diagnosis not present

## 2024-05-20 DIAGNOSIS — L810 Postinflammatory hyperpigmentation: Secondary | ICD-10-CM

## 2024-05-20 MED ORDER — DOXYCYCLINE HYCLATE 100 MG PO CAPS: 100.0000 mg | ORAL_CAPSULE | Freq: Two times a day (BID) | ORAL | 2 refills | Status: AC

## 2024-05-20 MED ORDER — CLINDAMYCIN PHOS-BENZOYL PEROX 1.2-5 % EX GEL: CUTANEOUS | 2 refills | Status: DC

## 2024-05-20 NOTE — Patient Instructions (Addendum)
 Treatment Plan: Start clindamycin /BP gel in the morning to spot treat.  Continue tretinoin  0.025% cr at bedtime every other night as tolerated.  Continue CeraVe cleanser and moisturizer.  Continue spironolactone  50 mg every day Restart doxycycline  100 mg every 1-2 times daily as needed.   Doxycycline  should be taken with food to prevent nausea. Do not lay down for 30 minutes after taking. Be cautious with sun exposure and use good sun protection while on this medication. Pregnant women should not take this medication.   Topical retinoid medications like tretinoin /Retin-A , adapalene/Differin, tazarotene/Fabior, and Epiduo/Epiduo Forte can cause dryness and irritation when first started. Only apply a pea-sized amount to the entire affected area. Avoid applying it around the eyes, edges of mouth and creases at the nose. If you experience irritation, use a good moisturizer first and/or apply the medicine less often. If you are doing well with the medicine, you can increase how often you use it until you are applying every night. Be careful with sun protection while using this medication as it can make you sensitive to the sun. This medicine should not be used by pregnant women.   Spironolactone  can cause increased urination and cause blood pressure to decrease. Please watch for signs of lightheadedness and be cautious when changing position. It can sometimes cause breast tenderness or an irregular period in premenopausal women. It can also increase potassium. The increase in potassium usually is not a concern unless you are taking other medicines that also increase potassium, so please be sure your doctor knows all of the other medications you are taking. This medication should not be taken by pregnant women.  This medicine should also not be taken together with sulfa  drugs like Bactrim  (trimethoprim /sulfamethexazole).   Due to recent changes in healthcare laws, you may see results of your pathology and/or  laboratory studies on MyChart before the doctors have had a chance to review them. We understand that in some cases there may be results that are confusing or concerning to you. Please understand that not all results are received at the same time and often the doctors may need to interpret multiple results in order to provide you with the best plan of care or course of treatment. Therefore, we ask that you please give us  2 business days to thoroughly review all your results before contacting the office for clarification. Should we see a critical lab result, you will be contacted sooner.   If You Need Anything After Your Visit  If you have any questions or concerns for your doctor, please call our main line at [phone] and press option 4 to reach your doctor's medical assistant. If no one answers, please leave a voicemail as directed and we will return your call as soon as possible. Messages left after 4 pm will be answered the following business day.   You may also send us  a message via MyChart. We typically respond to MyChart messages within 1-2 business days.  For prescription refills, please ask your pharmacy to contact our office. Our fax number is [phone].  If you have an urgent issue when the clinic is closed that cannot wait until the next business day, you can page your doctor at the number below.    Please note that while we do our best to be available for urgent issues outside of office hours, we are not available 24/7.   If you have an urgent issue and are unable to reach us , you may choose to seek medical care  at your doctor's office, retail clinic, urgent care center, or emergency room.  If you have a medical emergency, please immediately call 911 or go to the emergency department.  Pager Numbers  - Dr. Hester: [phone]  - Dr. Jackquline: [phone]  - Dr. Claudene: [phone]   - Dr. Raymund: [phone]  In the event of inclement weather, please call our main  line at [phone] for an update on the status of any delays or closures.  Dermatology Medication Tips: Please keep the boxes that topical medications come in in order to help keep track of the instructions about where and how to use these. Pharmacies typically print the medication instructions only on the boxes and not directly on the medication tubes.   If your medication is too expensive, please contact our office at [phone] option 4 or send us  a message through MyChart.   We are unable to tell what your co-pay for medications will be in advance as this is different depending on your insurance coverage. However, we may be able to find a substitute medication at lower cost or fill out paperwork to get insurance to cover a needed medication.   If a prior authorization is required to get your medication covered by your insurance company, please allow us  1-2 business days to complete this process.  Drug prices often vary depending on where the prescription is filled and some pharmacies may offer cheaper prices.  The website www.goodrx.com contains coupons for medications through different pharmacies. The prices here do not account for what the cost may be with help from insurance (it may be cheaper with your insurance), but the website can give you the price if you did not use any insurance.  - You can print the associated coupon and take it with your prescription to the pharmacy.  - You may also stop by our office during regular business hours and pick up a GoodRx coupon card.  - If you need your prescription sent electronically to a different pharmacy, notify our office through [facility] or by phone at [phone] option 4.     Si Usted Necesita Algo Despus de Su Visita  Tambin puede enviarnos un mensaje a travs de Clinical cytogeneticist. Por lo general respondemos a los mensajes de MyChart en el transcurso de 1 a 2 das hbiles.  Para renovar recetas, por favor pida a su farmacia que  se ponga en contacto con nuestra oficina. Randi lakes de fax es [place] [phone].  Si tiene un asunto urgente cuando la clnica est cerrada y que no puede esperar hasta el siguiente da hbil, puede llamar/localizar a su doctor(a) al nmero que aparece a continuacin.   Por favor, tenga en cuenta que aunque hacemos todo lo posible para estar disponibles para asuntos urgentes fuera del horario de [place], no estamos disponibles las 24 horas del da, los 7 [address]  [address] de la [place].   Si tiene un problema urgente y no puede comunicarse con nosotros, puede optar por buscar atencin mdica  en el consultorio de su doctor(a), en una clnica privada, en un centro de atencin urgente o en una sala de emergencias.  Si tiene Engineer, drilling, por favor llame inmediatamente al 911 o vaya a la sala de emergencias.  Nmeros de bper  - Dr. Hester: [phone]  - Dra. Jackquline: [phone]  - Dr. Claudene: [phone]  - Dra. Kitts: [phone]  En caso de inclemencias del [place], por favor llame a nuestra lnea principal al [phone] para una actualizacin sobre el [place]  de cualquier retraso o cierre.  Consejos para la medicacin en dermatologa: Por favor, guarde las cajas en las que vienen los medicamentos de uso tpico para ayudarle a seguir las instrucciones sobre dnde y cmo usarlos. Las farmacias generalmente imprimen las instrucciones del medicamento slo en las cajas y no directamente en los tubos del [place].   Si su medicamento es muy caro, por favor, pngase en contacto con landry rieger llamando al [phone] y presione la opcin 4 o envenos un mensaje a travs de Clinical cytogeneticist.   No podemos decirle cul ser su copago por los medicamentos por adelantado ya que esto es diferente dependiendo de la cobertura de su seguro. Sin embargo, es posible que podamos encontrar un medicamento sustituto a Audiological scientist un formulario para que el seguro cubra el medicamento que se  considera necesario.   Si se requiere una autorizacin previa para que su compaa de seguros malta su medicamento, por favor permtanos de 1 a 2 das hbiles para completar este proceso.  Los precios de los medicamentos varan con frecuencia dependiendo del Environmental consultant de dnde se surte la receta y alguna farmacias pueden ofrecer precios ms baratos.  El sitio web www.goodrx.com tiene cupones para medicamentos de Health and safety inspector. Los precios aqu no tienen en cuenta lo que podra costar con la ayuda del seguro (puede ser ms barato con su seguro), pero el sitio web puede darle el precio si no utiliz Tourist information centre manager.  - Puede imprimir el cupn correspondiente y llevarlo con su receta a la farmacia.  - Tambin puede pasar por nuestra oficina durante el horario de atencin regular y Education officer, museum una tarjeta de cupones de GoodRx.  - Si necesita que su receta se enve electrnicamente a una farmacia diferente, informe a nuestra oficina a travs de MyChart de [place] o por telfono llamando al [phone] y presione la opcin 4.

## 2024-05-20 NOTE — Progress Notes (Signed)
 Follow-Up Visit   Subjective  Veronica Ochoa is a 33 y.o. female who presents for the following: acne follow up, taking spironolactone  50 mg and using tretinoin  0.025% cream a few nights a week. She was taking doxycycline  prn but ran out. Patient would like to know if there is a cream she could use in the am in place of taking oral medications. Patient advises she did have an HS flare about a month ago at right axilla that drained.   She would like to have recommendations for hyperpigmentation at arms and face.   Eczema has cleared and she is not using anything at this time.    The following portions of the chart were reviewed this encounter and updated as appropriate: medications, allergies, medical history  Review of Systems:  No other skin or systemic complaints except as noted in HPI or Assessment and Plan.  Objective  Well appearing patient in no apparent distress; mood and affect are within normal limits.   A focused examination was performed of the following areas: Face, axilla  Relevant exam findings are noted in the Assessment and Plan.    Assessment & Plan   ACNE VULGARIS and PIH Exam: Open and closed comedones on forehead. inflammatory papules and PIH on cheeks chin  Chronic and persistent condition with duration or expected duration over one year. Condition is symptomatic/ bothersome to patient. Not currently at goal.  Treatment Plan: Start clindamycin /BP gel qam to spot treat. Can bleach clothing Continue tretinoin  0.025% cr at bedtime every other night as tolerated.  Continue CeraVe cleanser and moisturizer.  Continue spironolactone  50 mg every day Restart doxycycline  100 mg every day-bid prn. Start daily and increased to BID if not improving  Doxycycline  should be taken with food to prevent nausea. Do not lay down for 30 minutes after taking. Be cautious with sun exposure and use good sun protection while on this medication. Pregnant women should not take  this medication.   Topical retinoid medications like tretinoin /Retin-A , adapalene/Differin, tazarotene/Fabior, and Epiduo/Epiduo Forte can cause dryness and irritation when first started. Only apply a pea-sized amount to the entire affected area. Avoid applying it around the eyes, edges of mouth and creases at the nose. If you experience irritation, use a good moisturizer first and/or apply the medicine less often. If you are doing well with the medicine, you can increase how often you use it until you are applying every night. Be careful with sun protection while using this medication as it can make you sensitive to the sun. This medicine should not be used by pregnant women.   Spironolactone  can cause increased urination and cause blood pressure to decrease. Please watch for signs of lightheadedness and be cautious when changing position. It can sometimes cause breast tenderness or an irregular period in premenopausal women. It can also increase potassium. The increase in potassium usually is not a concern unless you are taking other medicines that also increase potassium, so please be sure your doctor knows all of the other medications you are taking. This medication should not be taken by pregnant women.  This medicine should also not be taken together with sulfa  drugs like Bactrim  (trimethoprim /sulfamethexazole).    Patient advised that results are better when consistent with topical medications.  Patient was pregnant in March but is no longer. She was advised that the acne medications prescribed are not pregnancy safe. Patient does not plan to get pregnant.   Hidradenitis suppurativa, chronic, recent flare that improved with spironolactone .  Patient defers exam  ACNE VULGARIS   HIDRADENITIS SUPPURATIVA   POSTINFLAMMATORY HYPERPIGMENTATION    Return in about 3 months (around 08/19/2024) for acne.  Veronica Ochoa, RMA, am acting as scribe for Boneta Sharps, MD  .   Documentation: I have reviewed the above documentation for accuracy and completeness, and I agree with the above.  Boneta Sharps, MD

## 2024-05-26 ENCOUNTER — Encounter (HOSPITAL_BASED_OUTPATIENT_CLINIC_OR_DEPARTMENT_OTHER): Admitting: Certified Nurse Midwife

## 2024-06-01 ENCOUNTER — Encounter: Payer: Self-pay | Admitting: Dermatology

## 2024-06-02 ENCOUNTER — Other Ambulatory Visit: Payer: Self-pay | Admitting: Dermatology

## 2024-06-02 ENCOUNTER — Telehealth: Payer: Self-pay

## 2024-06-02 DIAGNOSIS — B379 Candidiasis, unspecified: Secondary | ICD-10-CM

## 2024-06-02 MED ORDER — FLUCONAZOLE 150 MG PO TABS: ORAL_TABLET | ORAL | 1 refills | Status: DC

## 2024-06-02 NOTE — Telephone Encounter (Signed)
 Patient called, she started Doxycycline  as directed, she would like a rx for yeast infection she has developed.   I asked patient was she pregnant due to pop up message in this chart, patient report she is not pregnant

## 2024-06-16 ENCOUNTER — Encounter (HOSPITAL_BASED_OUTPATIENT_CLINIC_OR_DEPARTMENT_OTHER): Admitting: Certified Nurse Midwife

## 2024-06-30 ENCOUNTER — Encounter (HOSPITAL_BASED_OUTPATIENT_CLINIC_OR_DEPARTMENT_OTHER): Admitting: Obstetrics & Gynecology

## 2024-07-14 ENCOUNTER — Encounter (HOSPITAL_BASED_OUTPATIENT_CLINIC_OR_DEPARTMENT_OTHER): Admitting: Certified Nurse Midwife

## 2024-07-28 ENCOUNTER — Encounter (HOSPITAL_BASED_OUTPATIENT_CLINIC_OR_DEPARTMENT_OTHER): Admitting: Obstetrics & Gynecology

## 2024-08-04 ENCOUNTER — Encounter (HOSPITAL_BASED_OUTPATIENT_CLINIC_OR_DEPARTMENT_OTHER): Admitting: Certified Nurse Midwife

## 2024-08-11 ENCOUNTER — Encounter (HOSPITAL_BASED_OUTPATIENT_CLINIC_OR_DEPARTMENT_OTHER): Admitting: Obstetrics & Gynecology

## 2024-08-18 ENCOUNTER — Encounter (HOSPITAL_BASED_OUTPATIENT_CLINIC_OR_DEPARTMENT_OTHER): Admitting: Obstetrics & Gynecology

## 2024-08-19 ENCOUNTER — Ambulatory Visit: Admitting: Dermatology

## 2024-08-25 ENCOUNTER — Encounter (HOSPITAL_BASED_OUTPATIENT_CLINIC_OR_DEPARTMENT_OTHER): Admitting: Obstetrics & Gynecology

## 2024-09-01 ENCOUNTER — Ambulatory Visit: Admitting: Dermatology

## 2024-09-01 ENCOUNTER — Encounter: Payer: Self-pay | Admitting: Dermatology

## 2024-09-01 DIAGNOSIS — L732 Hidradenitis suppurativa: Secondary | ICD-10-CM | POA: Diagnosis not present

## 2024-09-01 DIAGNOSIS — L700 Acne vulgaris: Secondary | ICD-10-CM | POA: Diagnosis not present

## 2024-09-01 MED ORDER — CLINDAMYCIN PHOS-BENZOYL PEROX 1.2-5 % EX GEL: CUTANEOUS | 2 refills | Status: AC

## 2024-09-01 NOTE — Patient Instructions (Signed)

## 2024-09-01 NOTE — Progress Notes (Signed)
" ° °  Follow-Up Visit   Subjective  Veronica Ochoa is a 34 y.o. female who presents for the following: 3 month acne follow up. Patient not taking doxycycline . She has been taking spironolactone  50 mg daily but stopped a few weeks ago and takes as needed die to yeast infections. Patient is using clindamycin /BP in the am and tretinoin  0.025% cr at bedtime followed by Vaseline. Patient advises that she is still having breakouts.   Patient advises she did have a flare at axilla about 2 weeks ago and wants to know if she could use topicals to treat.   The following portions of the chart were reviewed this encounter and updated as appropriate: medications, allergies, medical history  Review of Systems:  No other skin or systemic complaints except as noted in HPI or Assessment and Plan.  Objective  Well appearing patient in no apparent distress; mood and affect are within normal limits.    A focused examination was performed of the following areas: face  Relevant exam findings are noted in the Assessment and Plan.    Assessment & Plan   ACNE VULGARIS Exam: closed comedones and inflammatory papules on forehead cheeks temples  Chronic and persistent condition with duration or expected duration over one year. Condition is symptomatic/ bothersome to patient. Not currently at goal.   Treatment Plan: Recommend non-comedogenic products on face instead of Vaseline.   Patient prefers to not take oral medications.  Samples of CeraVe Foaming cleanser and Hydrating cleanser given to patient to try.  Continue tretinoin  0.025% cr at bedtime as tolerated Continue clindamycin /BP qam to spot treat.   HIDRADENITIS SUPPURATIVA Exam: deferred  Chronic and persistent condition with duration or expected duration over one year. Condition is symptomatic/ bothersome to patient. Not currently at goal.  Hidradenitis Suppurativa is a chronic; persistent; non-curable, but treatable condition due to abnormal  inflamed sweat glands in the body folds (axilla, inframammary, groin, medial thighs), causing recurrent painful draining cysts and scarring. It can be associated with severe scarring acne and cysts; also abscesses and scarring of scalp. The goal is control and prevention of flares, as it is not curable. Scars are permanent and can be thickened. Treatment may include daily use of topical medication and oral antibiotics.  Oral isotretinoin may also be helpful.  For some cases, Humira or Cosentyx (biologic injections) may be prescribed to decrease the inflammatory process and prevent flares.  When indicated, inflamed cysts may also be treated surgically.  Treatment Plan: Offered increase in spironolactone . Patient prefers to not take oral medications.  Samples of CeraVe Foaming cleanser and Hydrating cleanser given to patient to try.  Continue clindamycin /BP qam to spot treat.  ACNE VULGARIS   HIDRADENITIS SUPPURATIVA    Return in about 3 months (around 11/30/2024) for acne, with Dr. Claudene.  Veronica Ochoa, RMA, am acting as scribe for Boneta Claudene, MD .   Documentation: I have reviewed the above documentation for accuracy and completeness, and I agree with the above.  Boneta Claudene, MD    "

## 2024-09-25 ENCOUNTER — Ambulatory Visit: Admitting: Internal Medicine

## 2024-09-25 ENCOUNTER — Encounter: Payer: Self-pay | Admitting: Internal Medicine

## 2024-09-25 VITALS — BP 112/78 | HR 83 | Ht 67.5 in | Wt 294.8 lb

## 2024-09-25 DIAGNOSIS — F411 Generalized anxiety disorder: Secondary | ICD-10-CM

## 2024-09-25 DIAGNOSIS — Z83438 Family history of other disorder of lipoprotein metabolism and other lipidemia: Secondary | ICD-10-CM | POA: Diagnosis not present

## 2024-09-25 DIAGNOSIS — R110 Nausea: Secondary | ICD-10-CM | POA: Diagnosis not present

## 2024-09-25 DIAGNOSIS — N761 Subacute and chronic vaginitis: Secondary | ICD-10-CM

## 2024-09-25 DIAGNOSIS — R5382 Chronic fatigue, unspecified: Secondary | ICD-10-CM

## 2024-09-25 DIAGNOSIS — E66813 Obesity, class 3: Secondary | ICD-10-CM

## 2024-09-25 DIAGNOSIS — Z6841 Body Mass Index (BMI) 40.0 and over, adult: Secondary | ICD-10-CM | POA: Diagnosis not present

## 2024-09-25 DIAGNOSIS — Z0130 Encounter for examination of blood pressure without abnormal findings: Secondary | ICD-10-CM

## 2024-09-25 DIAGNOSIS — N941 Unspecified dyspareunia: Secondary | ICD-10-CM

## 2024-09-25 LAB — POCT URINALYSIS DIPSTICK
Bilirubin, UA: NEGATIVE
Blood, UA: NEGATIVE
Glucose, UA: NEGATIVE
Ketones, UA: NEGATIVE
Nitrite, UA: NEGATIVE
Protein, UA: NEGATIVE
Spec Grav, UA: 1.015
Urobilinogen, UA: 0.2 U/dL
pH, UA: 8.5 — AB

## 2024-09-25 MED ORDER — CIPROFLOXACIN HCL 500 MG PO TABS: 500.0000 mg | ORAL_TABLET | Freq: Two times a day (BID) | ORAL | 0 refills | Status: AC

## 2024-09-25 MED ORDER — SEMAGLUTIDE-WEIGHT MANAGEMENT 0.25 MG/0.5ML ~~LOC~~ SOAJ: 0.2500 mg | SUBCUTANEOUS | 1 refills | Status: AC

## 2024-09-25 MED ORDER — BUPROPION HCL ER (XL) 150 MG PO TB24: 150.0000 mg | ORAL_TABLET | ORAL | 2 refills | Status: AC

## 2024-09-25 MED ORDER — ONDANSETRON 4 MG PO TBDP: 4.0000 mg | ORAL_TABLET | Freq: Three times a day (TID) | ORAL | 0 refills | Status: AC | PRN

## 2024-09-25 NOTE — Progress Notes (Signed)
 "  Established Patient Office Visit  Subjective:  Patient ID: Veronica Ochoa, female    DOB: [birth year]  Age: 34 y.o. MRN: [id number]  Chief Complaint  Patient presents with   Follow-up    Weight management    Patient is here today for follow up. She has concerns regarding changes in her mood and weight loss medications.  She has tried phentermine  and saw weight loss clinic through [place] last year. She has tried Wegovy  in the past and it caused nausea. Reports phentermine  caused her severe insomnia but had good weight loss. Discussed options with patient and will start Wegovy  once weekly injection and start Zofran  for nausea as needed. She reports joint pain and shortness of breath with over exertion that began after she started gaining weight. Reinforced healthy diet and exercise as tolerated.  Patient reports last menstrual cycle 09/12/24. Reports long history of heavy/ irregular periods. Patient is unsure if she has PCOS. 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 Will check UA and vaginal swab. Informed patient that at any time if she becomes pregnant she must stop Wegovy  injections. She reports not currently using preventative birth control measures. Will send urine for culture as UA has trace leukocytes. Will send in empiric abx in the mean time.  She reports having depression and anxiety but has been able to cope without medication until recently. She reports having increased mood swings and being more irritated than usual and is interested in medication at this time. PHQ-9 score today 10; GAD-7 score 7. Will start Wellbutrin  150 mg daily. Educated patient on community resources for mental health and when to seek emergency management such as thoughts of self harm or harming others. Patient denies SI at this time.    No other concerns at this time.   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   Preterm premature rupture of membranes (PPROM) with onset of labor within 24 hours of rupture in second trimester, antepartum 08/16/2019    Past Surgical History:  Procedure Laterality Date   CESAREAN SECTION  06/2020   HYDRADENITIS EXCISION Bilateral 03/14/2021   Procedure: EXCISION OF BILATERAL HIDRADENITIS AXILLA  AND RECURRENT ABCESS;  Surgeon: Gladis Cough, MD;  Location: [facility];  Service: General;  Laterality: Bilateral;   miscarriage      Social History   Socioeconomic History   Marital status: Single    Spouse name: Not on file   Number of children: 1   Years of education: Not on file   Highest education level: Not on file  Occupational History   Not on file  Tobacco Use   Smoking status: Never   Smokeless tobacco: Never  Vaping Use   Vaping status: Never Used  Substance and Sexual Activity   Alcohol use: Not Currently    Comment: occasional   Drug use: No   Sexual activity: Not Currently    Partners: Male    Birth control/protection: None  Other Topics Concern   Not on file  Social History Narrative   Not on file   Social Drivers of Health   Tobacco Use: Low Risk (09/25/2024)   Patient History    Smoking Tobacco Use: Never  Smokeless Tobacco Use: Never    Passive Exposure: Not on file  Financial Resource Strain: Low Risk (01/17/2024)   Overall Financial Resource Strain (CARDIA)    Difficulty of Paying Living Expenses: Not very hard  Food Insecurity: No Food Insecurity (01/17/2024)   Hunger Vital Sign    Worried About Running Out of Food in the Last Year: Never true    Ran Out of Food in the Last Year: Never true  Transportation Needs: No Transportation Needs (01/17/2024)   PRAPARE - Administrator, Civil Service (Medical): No     Lack of Transportation (Non-Medical): No  Physical Activity: Insufficiently Active (01/17/2024)   Exercise Vital Sign    Days of Exercise per Week: 1 day    Minutes of Exercise per Session: 30 min  Stress: No Stress Concern Present (01/17/2024)   [employer] of Occupational Health - Occupational Stress Questionnaire    Feeling of Stress : Only a little  Social Connections: Socially Isolated (01/17/2024)   Social Connection and Isolation Panel    Frequency of Communication with Friends and Family: Once a week    Frequency of Social Gatherings with Friends and Family: Never    Attends Religious Services: More than 4 times per year    Active Member of Clubs or Organizations: No    Attends Banker Meetings: Never    Marital Status: Never married  Intimate Partner Violence: Not At Risk (01/17/2024)   Humiliation, Afraid, Rape, and Kick questionnaire    Fear of Current or Ex-Partner: No    Emotionally Abused: No    Physically Abused: No    Sexually Abused: No  Depression (PHQ2-9): Medium Risk (09/25/2024)   Depression (PHQ2-9)    PHQ-2 Score: 10  Alcohol Screen: Low Risk (01/17/2024)   Alcohol Screen    Last Alcohol Screening Score (AUDIT): 0  Housing: Low Risk (01/17/2024)   Housing Stability Vital Sign    Unable to Pay for Housing in the Last Year: No    Number of Times Moved in the Last Year: 0    Homeless in the Last Year: No  Utilities: Not At Risk (01/17/2024)   [employer] Utilities    Threatened with loss of utilities: No  Health Literacy: Adequate Health Literacy (01/17/2024)   B1300 Health Literacy    Frequency of need for help with medical instructions: Never    Family History  Problem Relation Age of Onset   Diabetes Father    Arthritis Maternal Grandmother    Diabetes Maternal Grandmother    Asthma Maternal Grandmother     Allergies[1]  Show/hide medication list[2]  Review of Systems  Constitutional:  Positive for malaise/fatigue. Negative for chills and  fever.  HENT: Negative.  Negative for congestion and sore throat.   Eyes: Negative.  Negative for blurred vision and pain.  Respiratory:  Positive for shortness of breath (with exertion). Negative for cough.   Cardiovascular: Negative.  Negative for chest pain, palpitations and leg swelling.  Gastrointestinal: Negative.  Negative for abdominal pain, blood in stool, constipation, diarrhea, heartburn, melena, nausea and vomiting.  Genitourinary: Negative.  Negative for dysuria, flank pain, frequency and urgency.  Musculoskeletal: Negative.  Negative for joint pain and myalgias.  Skin: Negative.   Neurological: Negative.  Negative for dizziness, tingling, sensory change, weakness and headaches.  Endo/Heme/Allergies: Negative.   Psychiatric/Behavioral:  Positive for depression. Negative for suicidal ideas. The patient is nervous/anxious.        Objective:   BP 112/78  Pulse 83   Ht 5' 7.5 (1.715 m)   Wt 294 lb 12.8 oz (133.7 kg)   LMP 10/23/2023   SpO2 99%   Breastfeeding Unknown   BMI 45.49 kg/m   Vitals:   09/25/24 1542  BP: 112/78  Pulse: 83  Height: 5' 7.5 (1.715 m)  Weight: 294 lb 12.8 oz (133.7 kg)  SpO2: 99%  BMI (Calculated): 45.46    Physical Exam Vitals and nursing note reviewed.  Constitutional:      Appearance: Normal appearance.  HENT:     Head: Normocephalic and atraumatic.     Nose: Nose normal.     Mouth/Throat:     Mouth: Mucous membranes are moist.     Pharynx: Oropharynx is clear.  Eyes:     Conjunctiva/sclera: Conjunctivae normal.     Pupils: Pupils are equal, round, and reactive to light.  Cardiovascular:     Rate and Rhythm: Normal rate and regular rhythm.     Pulses: Normal pulses.     Heart sounds: Normal heart sounds. No murmur heard. Pulmonary:     Effort: Pulmonary effort is normal.     Breath sounds: Normal breath sounds. No wheezing.  Abdominal:     General: Bowel sounds are normal.     Palpations: Abdomen is soft.      Tenderness: There is no abdominal tenderness. There is no right CVA tenderness or left CVA tenderness.  Musculoskeletal:        General: Normal range of motion.     Cervical back: Normal range of motion.     Right lower leg: No edema.     Left lower leg: No edema.  Skin:    General: Skin is warm and dry.  Neurological:     General: No focal deficit present.     Mental Status: She is alert and oriented to person, place, and time.  Psychiatric:        Mood and Affect: Mood normal.        Behavior: Behavior normal.      Results for orders placed or performed in visit on 09/25/24  POCT Urinalysis Dipstick (81002)  Result Value Ref Range   Color, UA Yellow    Clarity, UA Clear    Glucose, UA Negative Negative   Bilirubin, UA Negative    Ketones, UA Negative    Spec Grav, UA 1.015 1.010 - 1.025   Blood, UA Negative    pH, UA 8.5 (A) 5.0 - 8.0   Protein, UA Negative Negative   Urobilinogen, UA 0.2 0.2 or 1.0 E.U./dL   Nitrite, UA Negative    Leukocytes, UA Trace (A) Negative   Appearance Clear    Odor Yes     Recent Results (from the past 2160 hours)  POCT Urinalysis Dipstick (18997)     Status: Abnormal   Collection Time: 09/25/24  4:14 PM  Result Value Ref Range   Color, UA Yellow    Clarity, UA Clear    Glucose, UA Negative Negative   Bilirubin, UA Negative    Ketones, UA Negative    Spec Grav, UA 1.015 1.010 - 1.025   Blood, UA Negative    pH, UA 8.5 (A) 5.0 - 8.0   Protein, UA Negative Negative   Urobilinogen, UA 0.2 0.2 or 1.0 E.U./dL   Nitrite, UA Negative    Leukocytes, UA Trace (A) Negative   Appearance Clear    Odor Yes       Assessment & Plan:  Start Wegovy  once weekly injection. Start Zofran  as needed for nausea. Start Wellbutrin  once daily. Reinforced healthy diet and exercise as tolerated to promote overall wellbeing and promote weight loss. Discussed healthy coping skills and stress reduction techniques. Check UA, urine culture, Nuswab. Empiric Cipro   sent. Patient to return for fasting blood work. Problem List Items Addressed This Visit       Genitourinary   Subacute vaginitis   Relevant Medications   ciprofloxacin  (CIPRO ) 500 MG tablet   Other Relevant Orders   Urine Culture     Other   Nausea - Primary   Relevant Medications   ondansetron  (ZOFRAN -ODT) 4 MG disintegrating tablet   Class 3 severe obesity due to excess calories with serious comorbidity and body mass index (BMI) of 45.0 to 49.9 in adult [facility])   Relevant Medications   semaglutide -weight management (WEGOVY ) 0.25 MG/0.5ML SOAJ SQ injection   Other Relevant Orders   CBC with Diff   CMP14+EGFR   Pain in female genitalia on intercourse   Relevant Medications   ciprofloxacin  (CIPRO ) 500 MG tablet   Other Relevant Orders   POCT Urinalysis Dipstick (81002) (Completed)   NuSwab VG+, HSV   CBC with Diff   Urine Culture   GAD (generalized anxiety disorder)   Relevant Medications   buPROPion  (WELLBUTRIN  XL) 150 MG 24 hr tablet   Chronic fatigue   Relevant Orders   TSH   Vitamin D (25 hydroxy)   Vitamin B12   Family history of combined hyperlipidemia   Relevant Orders   Lipid Panel w/o Chol/HDL Ratio    Return in about 1 month (around 10/25/2024).   Total time spent: 30 minutes. This time includes review of previous notes and results and patient face to face interaction during today's visit.    FERNAND FREDY RAMAN, MD  09/25/2024   This document may have been prepared by [facility] Voice Recognition software and as such may include unintentional dictation errors.     [1]  Allergies Allergen Reactions   Morphine Hives   Other Hives and Other (See Comments)   Vancomycin Hives and Swelling   Latex Swelling   Morphine And Codeine  Hives  [2]  Outpatient Medications Prior to Visit  Medication Sig   Clindamycin -Benzoyl Per, Refr, gel Apply to aa face in the morning to spot treat acne.   [DISCONTINUED] Doxylamine -Pyridoxine  10-10 MG TBEC Take 2 tablets at night    [DISCONTINUED] fluconazole  (DIFLUCAN ) 150 MG tablet Take one tablet for yeast infection. If infection does not resolve, take a second tablet one week later (Patient not taking: Reported on 09/25/2024)   [DISCONTINUED] promethazine  (PHENERGAN ) 25 MG tablet Take 1 tablet (25 mg total) by mouth every 6 (six) hours as needed. (Patient not taking: Reported on 09/25/2024)   No facility-administered medications prior to visit.   "

## 2024-09-29 LAB — NUSWAB VG+, HSV
Candida albicans, NAA: POSITIVE — AB
Candida glabrata, NAA: NEGATIVE
Chlamydia trachomatis, NAA: NEGATIVE
HSV 1 NAA: NEGATIVE
HSV 2 NAA: NEGATIVE
Neisseria gonorrhoeae, NAA: NEGATIVE
Trich vag by NAA: NEGATIVE

## 2024-09-30 ENCOUNTER — Ambulatory Visit: Payer: Self-pay | Admitting: Internal Medicine

## 2024-09-30 DIAGNOSIS — B3731 Acute candidiasis of vulva and vagina: Secondary | ICD-10-CM

## 2024-09-30 LAB — URINE CULTURE

## 2024-09-30 MED ORDER — METRONIDAZOLE 500 MG PO TABS: 500.0000 mg | ORAL_TABLET | Freq: Two times a day (BID) | ORAL | 0 refills | Status: AC

## 2024-09-30 NOTE — Progress Notes (Signed)
 Patient notified

## 2024-10-01 ENCOUNTER — Encounter: Payer: Self-pay | Admitting: Internal Medicine

## 2024-10-27 ENCOUNTER — Ambulatory Visit: Admitting: Internal Medicine

## 2024-12-01 ENCOUNTER — Ambulatory Visit: Admitting: Dermatology
